# Patient Record
Sex: Male | Born: 1975 | Race: Black or African American | Hispanic: No | Marital: Married | Smoking: Never smoker
Health system: Southern US, Community
[De-identification: ages and names within clinical notes are randomized; demographics above are authoritative.]

## PROBLEM LIST (undated history)

## (undated) ENCOUNTER — Ambulatory Visit: Admission: EM

---

## 2001-10-26 ENCOUNTER — Emergency Department (HOSPITAL_COMMUNITY): Admission: EM | Admit: 2001-10-26 | Discharge: 2001-10-26 | Payer: Self-pay | Admitting: Emergency Medicine

## 2008-03-25 ENCOUNTER — Emergency Department (HOSPITAL_COMMUNITY): Admission: EM | Admit: 2008-03-25 | Discharge: 2008-03-25 | Payer: Self-pay | Admitting: Emergency Medicine

## 2012-11-18 ENCOUNTER — Emergency Department (HOSPITAL_BASED_OUTPATIENT_CLINIC_OR_DEPARTMENT_OTHER)
Admission: EM | Admit: 2012-11-18 | Discharge: 2012-11-18 | Disposition: A | Discharge status: Home / Self Care | Payer: Managed Care, Other (non HMO) | Attending: Emergency Medicine | Admitting: Emergency Medicine

## 2012-11-18 ENCOUNTER — Encounter (HOSPITAL_BASED_OUTPATIENT_CLINIC_OR_DEPARTMENT_OTHER): Payer: Self-pay

## 2012-11-18 DIAGNOSIS — R197 Diarrhea, unspecified: Secondary | ICD-10-CM | POA: Insufficient documentation

## 2012-11-18 DIAGNOSIS — R509 Fever, unspecified: Secondary | ICD-10-CM | POA: Insufficient documentation

## 2012-11-18 DIAGNOSIS — R52 Pain, unspecified: Secondary | ICD-10-CM | POA: Insufficient documentation

## 2012-11-18 DIAGNOSIS — R1110 Vomiting, unspecified: Secondary | ICD-10-CM | POA: Insufficient documentation

## 2012-11-18 LAB — COMPREHENSIVE METABOLIC PANEL
ALT: 27 U/L (ref 0–53)
Albumin: 3.7 g/dL (ref 3.5–5.2)
Albumin: 3.3 g/dL — ABNORMAL LOW (ref 3.5–5.2)
BUN: 11 mg/dL (ref 6–23)
BUN: 11 mg/dL (ref 6–23)
Calcium: 8 mg/dL — ABNORMAL LOW (ref 8.4–10.5)
Chloride: 98 mEq/L (ref 96–112)
Creatinine, Ser: 0.9 mg/dL (ref 0.50–1.35)
GFR calc non Af Amer: >90 mL/min (ref >90)
Sodium: 135 mEq/L (ref 135–145)
Total Bilirubin: 0.6 mg/dL (ref 0.3–1.2)
Total Protein: 6.5 g/dL (ref 6.0–8.3)

## 2012-11-18 LAB — CBC
Hemoglobin: 14 g/dL (ref 13.0–17.0)
MCHC: 33.8 g/dL (ref 30.0–36.0)
RDW: 15.2 % (ref 11.5–15.5)

## 2012-11-18 MED ORDER — SODIUM CHLORIDE 0.9 % IV BOLUS (SEPSIS)
1000.0000 mL | Freq: Once | INTRAVENOUS | Status: AC
  Administered 2012-11-18: 1000 mL via INTRAVENOUS

## 2012-11-18 MED ORDER — IBUPROFEN 800 MG PO TABS
800.0000 mg | ORAL_TABLET | Freq: Once | ORAL | Status: AC
  Administered 2012-11-18: 800 mg via ORAL
  Filled 2012-11-18: qty 1

## 2012-11-18 MED ORDER — POTASSIUM CHLORIDE CRYS ER 20 MEQ PO TBCR
40.0000 meq | EXTENDED_RELEASE_TABLET | Freq: Once | ORAL | Status: AC
  Administered 2012-11-18: 40 meq via ORAL
  Filled 2012-11-18: qty 2

## 2012-11-18 NOTE — ED Provider Notes (Signed)
Medical screening examination/treatment/procedure(s) were performed by non-physician practitioner and as supervising physician I was immediately available for consultation/collaboration.    Aerica Rincon L Omayra Tulloch, MD 11/18/12 1426 

## 2012-11-18 NOTE — ED Notes (Signed)
Pt reports onset of vomiting and abdominal pain unrelieved after taking Zofran.  He was seen at Urgent Care yesterday for same.

## 2012-11-18 NOTE — ED Provider Notes (Signed)
History     CSN: [id number]  Arrival date & time 11/18/12  1142   First MD Initiated Contact with Patient 11/18/12 1213      Chief Complaint  Patient presents with  . Emesis  . Diarrhea    (Consider location/radiation/quality/duration/timing/severity/associated sxs/prior treatment) Patient is a 37 y.o. male presenting with vomiting and diarrhea. The history is provided by the patient. No language interpreter was used.  Emesis Severity:  Moderate Duration:  2 days Timing:  Constant Number of daily episodes:  Multiple Quality:  Unable to specify Able to tolerate:  Liquids Progression:  Partially resolved Worsened by:  Nothing tried Ineffective treatments:  None tried Associated symptoms: diarrhea   Diarrhea Associated symptoms: vomiting   Pt complains of vomiting and diarhea since yesterday.   Pt complains of fever and bodyaches  History reviewed. No pertinent past medical history.  History reviewed. No pertinent past surgical history.  No family history on file.  History  Substance Use Topics  . Smoking status: Never Smoker   . Smokeless tobacco: Not on file  . Alcohol Use: No      Review of Systems  Gastrointestinal: Positive for vomiting and diarrhea.  All other systems reviewed and are negative.    Allergies  Review of patient's allergies indicates no known allergies.  Home Medications   Current Outpatient Rx  Name  Route  Sig  Dispense  Refill  . ondansetron (ZOFRAN-ODT) 4 MG disintegrating tablet   Oral   Take 4 mg by mouth every 8 (eight) hours as needed for nausea.           BP 107/95  Pulse 102  Temp(Src) 101.8 F (38.8 C) (Oral)  Resp 24  Ht 5\' 6"  (1.676 m)  Wt 206 lb (93.441 kg)  BMI 33.27 kg/m2  SpO2 94%  Physical Exam  Nursing note and vitals reviewed. Constitutional: He is oriented to person, place, and time. He appears well-developed and well-nourished.  HENT:  Head: Normocephalic and atraumatic.  Right Ear: External ear  normal.  Left Ear: External ear normal.  Eyes: Conjunctivae are normal. Pupils are equal, round, and reactive to light.  Neck: Normal range of motion. Neck supple.  Cardiovascular: Normal rate, regular rhythm and normal heart sounds.   Pulmonary/Chest: Effort normal and breath sounds normal.  Abdominal: Soft. Bowel sounds are normal.  Musculoskeletal: Normal range of motion.  Neurological: He is alert and oriented to person, place, and time. He has normal reflexes.  Skin: Skin is warm.  Psychiatric: He has a normal mood and affect.    ED Course  Procedures (including critical care time)  Labs Reviewed  CBC - Abnormal; Notable for the following:    MCV 74.1 (*)    MCH 25.0 (*)    All other components within normal limits  COMPREHENSIVE METABOLIC PANEL - Abnormal; Notable for the following:    Glucose, Bld 125 (*)    All other components within normal limits  COMPREHENSIVE METABOLIC PANEL - Abnormal; Notable for the following:    Potassium 2.9 (*)    Glucose, Bld 118 (*)    Calcium 8.0 (*)    Albumin 3.3 (*)    All other components within normal limits   No results found.   1. Vomiting   2. Diarrhea       MDM  IV nS x 2 liters,  Ibuprofen for fever,  Pt given potasium for k of 2.9   Pt feels better after fluids.  Lonia Skinner [place], PA-C 11/18/12 1420

## 2012-11-18 NOTE — ED Notes (Signed)
Lab called and sts cmp hemolyzed. New draw will be performed.

## 2015-07-22 ENCOUNTER — Emergency Department (HOSPITAL_BASED_OUTPATIENT_CLINIC_OR_DEPARTMENT_OTHER)
Admission: EM | Admit: 2015-07-22 | Discharge: 2015-07-22 | Disposition: A | Discharge status: Home / Self Care | Payer: Managed Care, Other (non HMO) | Attending: Emergency Medicine | Admitting: Emergency Medicine

## 2015-07-22 ENCOUNTER — Emergency Department (HOSPITAL_BASED_OUTPATIENT_CLINIC_OR_DEPARTMENT_OTHER): Payer: Managed Care, Other (non HMO)

## 2015-07-22 ENCOUNTER — Encounter (HOSPITAL_BASED_OUTPATIENT_CLINIC_OR_DEPARTMENT_OTHER): Payer: Self-pay | Admitting: *Deleted

## 2015-07-22 DIAGNOSIS — R1084 Generalized abdominal pain: Secondary | ICD-10-CM | POA: Insufficient documentation

## 2015-07-22 DIAGNOSIS — R112 Nausea with vomiting, unspecified: Secondary | ICD-10-CM | POA: Insufficient documentation

## 2015-07-22 LAB — URINALYSIS, ROUTINE W REFLEX MICROSCOPIC
BILIRUBIN URINE: NEGATIVE
GLUCOSE, UA: NEGATIVE mg/dL
HGB URINE DIPSTICK: NEGATIVE
Ketones, ur: NEGATIVE mg/dL
Leukocytes, UA: NEGATIVE
Nitrite: NEGATIVE
PH: 8.5 — AB (ref 5.0–8.0)
Protein, ur: NEGATIVE mg/dL
SPECIFIC GRAVITY, URINE: 1.017 (ref 1.005–1.030)

## 2015-07-22 LAB — COMPREHENSIVE METABOLIC PANEL
ALBUMIN: 4.5 g/dL (ref 3.5–5.0)
ALT: 22 U/L (ref 17–63)
AST: 26 U/L (ref 15–41)
Alkaline Phosphatase: 63 U/L (ref 38–126)
Anion gap: 8 (ref 5–15)
BUN: 12 mg/dL (ref 6–20)
CALCIUM: 9.5 mg/dL (ref 8.9–10.3)
CO2: 28 mmol/L (ref 22–32)
CREATININE: 0.88 mg/dL (ref 0.61–1.24)
Chloride: 104 mmol/L (ref 101–111)
GFR calc Af Amer: >60 mL/min (ref >60)
GLUCOSE: 121 mg/dL — AB (ref 65–99)
Potassium: 3.2 mmol/L — ABNORMAL LOW (ref 3.5–5.1)
Sodium: 140 mmol/L (ref 135–145)
Total Bilirubin: 0.5 mg/dL (ref 0.3–1.2)
Total Protein: 8 g/dL (ref 6.5–8.1)

## 2015-07-22 LAB — CBC WITH DIFFERENTIAL/PLATELET
BASOS PCT: 0 %
Basophils Absolute: 0 10*3/uL (ref 0.0–0.1)
Eosinophils Absolute: 0.3 10*3/uL (ref 0.0–0.7)
Eosinophils Relative: 3 %
HCT: 44.5 % (ref 39.0–52.0)
Hemoglobin: 14.3 g/dL (ref 13.0–17.0)
LYMPHS ABS: 1.7 10*3/uL (ref 0.7–4.0)
Lymphocytes Relative: 21 %
MCH: 24.6 pg — ABNORMAL LOW (ref 26.0–34.0)
MCHC: 32.1 g/dL (ref 30.0–36.0)
MCV: 76.5 fL — AB (ref 78.0–100.0)
MONOS PCT: 7 %
Monocytes Absolute: 0.6 10*3/uL (ref 0.1–1.0)
NEUTROS ABS: 5.5 10*3/uL (ref 1.7–7.7)
NEUTROS PCT: 68 %
PLATELETS: 229 10*3/uL (ref 150–400)
RBC: 5.82 MIL/uL — AB (ref 4.22–5.81)
RDW: 15.3 % (ref 11.5–15.5)
WBC: 8 10*3/uL (ref 4.0–10.5)

## 2015-07-22 LAB — LIPASE, BLOOD: Lipase: 25 U/L (ref 11–51)

## 2015-07-22 MED ORDER — IOHEXOL 300 MG/ML  SOLN: 100.0000 mL | Freq: Once | INTRAMUSCULAR | Status: DC | PRN

## 2015-07-22 MED ORDER — OXYCODONE-ACETAMINOPHEN 5-325 MG PO TABS: 1.0000 | ORAL_TABLET | ORAL | Status: DC | PRN

## 2015-07-22 MED ORDER — FAMOTIDINE IN NACL 20-0.9 MG/50ML-% IV SOLN
20.0000 mg | Freq: Once | INTRAVENOUS | Status: AC
  Administered 2015-07-22: 20 mg via INTRAVENOUS
  Filled 2015-07-22: qty 50

## 2015-07-22 MED ORDER — IOHEXOL 300 MG/ML  SOLN
50.0000 mL | Freq: Once | INTRAMUSCULAR | Status: DC | PRN
  Administered 2015-07-22: 50 mL via INTRAVENOUS

## 2015-07-22 MED ORDER — SODIUM CHLORIDE 0.9 % IV BOLUS (SEPSIS)
500.0000 mL | Freq: Once | INTRAVENOUS | Status: AC
  Administered 2015-07-22: 500 mL via INTRAVENOUS

## 2015-07-22 MED ORDER — ONDANSETRON HCL 4 MG/2ML IJ SOLN
4.0000 mg | Freq: Once | INTRAMUSCULAR | Status: AC
  Administered 2015-07-22: 4 mg via INTRAVENOUS
  Filled 2015-07-22: qty 2

## 2015-07-22 MED ORDER — IOHEXOL 300 MG/ML  SOLN
50.0000 mL | Freq: Once | INTRAMUSCULAR | Status: AC | PRN
  Administered 2015-07-22: 50 mL via ORAL

## 2015-07-22 MED ORDER — MORPHINE SULFATE (PF) 4 MG/ML IV SOLN
4.0000 mg | Freq: Once | INTRAVENOUS | Status: AC
  Administered 2015-07-22: 4 mg via INTRAVENOUS
  Filled 2015-07-22: qty 1

## 2015-07-22 MED ORDER — ONDANSETRON HCL 4 MG PO TABS: 4.0000 mg | ORAL_TABLET | Freq: Four times a day (QID) | ORAL | Status: DC

## 2015-07-22 NOTE — ED Notes (Signed)
C/o rt side abd pain onset around 2200 last pm w nausea,  Vomited x 1

## 2015-07-22 NOTE — ED Notes (Signed)
Abdominal pain since 10pm tonight.  Reports vomiting x 1.  Denies diarrhea.  RLQ and RUQ intermittent pain.

## 2015-07-22 NOTE — ED Provider Notes (Signed)
CSN: [id number]     Arrival date & time 07/22/15  0304 History   First MD Initiated Contact with Patient 07/22/15 [phone]     Chief Complaint  Patient presents with  . Abdominal Pain     (Consider location/radiation/quality/duration/timing/severity/associated sxs/prior Treatment) HPI Comments: Patient presents to the ER for evaluation of right-sided abdominal pain. Patient reports onset of pain around 10 PM tonight after eating spicy food. Pain has been constant, moderate to severe. He has had nausea and has vomited once. No diarrhea. No hematemesis or rectal bleeding.  Patient is a 39 y.o. male presenting with abdominal pain.  Abdominal Pain Associated symptoms: nausea and vomiting     History reviewed. No pertinent past medical history. History reviewed. No pertinent past surgical history. History reviewed. No pertinent family history. Social History  Substance Use Topics  . Smoking status: Never Smoker   . Smokeless tobacco: None  . Alcohol Use: No    Review of Systems  Gastrointestinal: Positive for nausea, vomiting and abdominal pain.  All other systems reviewed and are negative.     Allergies  Review of patient's allergies indicates no known allergies.  Home Medications   Prior to Admission medications   Not on File   BP 133/79 mmHg  Pulse 64  Temp(Src) 98.4 F (36.9 C) (Oral)  Resp 18  Ht 5\' 6"  (1.676 m)  Wt 205 lb (92.987 kg)  BMI 33.10 kg/m2  SpO2 99% Physical Exam  Constitutional: He is oriented to person, place, and time. He appears well-developed and well-nourished. No distress.  HENT:  Head: Normocephalic and atraumatic.  Right Ear: Hearing normal.  Left Ear: Hearing normal.  Nose: Nose normal.  Mouth/Throat: Oropharynx is clear and moist and mucous membranes are normal.  Eyes: Conjunctivae and EOM are normal. Pupils are equal, round, and reactive to light.  Neck: Normal range of motion. Neck supple.  Cardiovascular: Regular rhythm, S1 normal and  S2 normal.  Exam reveals no gallop and no friction rub.   No murmur heard. Pulmonary/Chest: Effort normal and breath sounds normal. No respiratory distress. He exhibits no tenderness.  Abdominal: Soft. Normal appearance and bowel sounds are normal. There is no hepatosplenomegaly. There is tenderness in the right upper quadrant. There is no rebound, no guarding, no tenderness at McBurney's point and negative Murphy's sign. No hernia.  Musculoskeletal: Normal range of motion.  Neurological: He is alert and oriented to person, place, and time. He has normal strength. No cranial nerve deficit or sensory deficit. Coordination normal. GCS eye subscore is 4. GCS verbal subscore is 5. GCS motor subscore is 6.  Skin: Skin is warm, dry and intact. No rash noted. No cyanosis.  Psychiatric: He has a normal mood and affect. His speech is normal and behavior is normal. Thought content normal.  Nursing note and vitals reviewed.   ED Course  Procedures (including critical care time) Labs Review Labs Reviewed  CBC WITH DIFFERENTIAL/PLATELET - Abnormal; Notable for the following:    RBC 5.82 (*)    MCV 76.5 (*)    MCH 24.6 (*)    All other components within normal limits  COMPREHENSIVE METABOLIC PANEL - Abnormal; Notable for the following:    Potassium 3.2 (*)    Glucose, Bld 121 (*)    All other components within normal limits  URINALYSIS, ROUTINE W REFLEX MICROSCOPIC (NOT AT [facility]) - Abnormal; Notable for the following:    APPearance CLOUDY (*)    pH 8.5 (*)    All other  components within normal limits  LIPASE, BLOOD    Imaging Review Ct Abdomen Pelvis W Contrast  07/22/2015  CLINICAL DATA:  Abdominal pain beginning last night, vomiting. RIGHT upper and lower quadrant pain. EXAM: CT ABDOMEN AND PELVIS WITH CONTRAST TECHNIQUE: Multidetector CT imaging of the abdomen and pelvis was performed using the standard protocol following bolus administration of intravenous contrast. CONTRAST:  50mL OMNIPAQUE  IOHEXOL 300 MG/ML SOLN, 50mL OMNIPAQUE IOHEXOL 300 MG/ML SOLN COMPARISON:  None. FINDINGS: LUNG BASES: Included view of the lung bases are clear. Visualized heart and pericardium are unremarkable. SOLID ORGANS: The liver, spleen, gallbladder, pancreas and adrenal glands are unremarkable. GASTROINTESTINAL TRACT: Small hiatal hernia. The stomach is distended with contrast. Small duodenum diverticulum. Small and large bowel are normal in course and caliber without inflammatory changes. Normal appendix. KIDNEYS/ URINARY TRACT: Kidneys are orthotopic, demonstrating symmetric enhancement. No nephrolithiasis, hydronephrosis or solid renal masses. The unopacified ureters are normal in course and caliber. Urinary bladder is partially distended and unremarkable. PERITONEUM/RETROPERITONEUM: Aortoiliac vessels are normal in course and caliber. No lymphadenopathy by CT size criteria. Prostate size is normal. No intraperitoneal free fluid nor free air. SOFT TISSUE/OSSEOUS STRUCTURES: Non-suspicious. Mild congenital canal narrowing of the lumbar spine on the basis of foreshortened pedicles. Broad thoracolumbar dextroscoliosis could be positional. IMPRESSION: Contrast distended stomach. No acute intra-abdominal or pelvic process. Normal appendix. Electronically Signed   By: Awilda Metro M.D.   On: 07/22/2015 05:13   I have personally reviewed and evaluated these images and lab results as part of my medical decision-making.   EKG Interpretation None      MDM   Final diagnoses:  None   abdominal pain  Presents to the emergency department for evaluation of abdominal pain. Patient reports symptoms began around 10 PM. He had eaten prior to onset of pain. He has vomited once. Examination revealed mild tenderness diffusely on the right side with rebound. No signs of peritonitis or acute surgical process. Lab work was unremarkable. CT scan performed to further evaluate right-sided pain. No acute abnormality noted other  than distended stomach. Patient treated with analgesia, will discharge continue symptomatic treatment.    Gilda Crease, MD 07/22/15 [phone]

## 2015-07-22 NOTE — Discharge Instructions (Signed)

## 2017-11-22 DIAGNOSIS — J209 Acute bronchitis, unspecified: Secondary | ICD-10-CM | POA: Diagnosis not present

## 2017-11-22 DIAGNOSIS — J309 Allergic rhinitis, unspecified: Secondary | ICD-10-CM | POA: Diagnosis not present

## 2018-09-20 DIAGNOSIS — J45909 Unspecified asthma, uncomplicated: Secondary | ICD-10-CM | POA: Diagnosis not present

## 2018-09-20 DIAGNOSIS — J069 Acute upper respiratory infection, unspecified: Secondary | ICD-10-CM | POA: Diagnosis not present

## 2018-11-20 ENCOUNTER — Observation Stay (HOSPITAL_COMMUNITY): Payer: BLUE CROSS/BLUE SHIELD

## 2018-11-20 ENCOUNTER — Encounter (HOSPITAL_BASED_OUTPATIENT_CLINIC_OR_DEPARTMENT_OTHER): Payer: Self-pay

## 2018-11-20 ENCOUNTER — Emergency Department (HOSPITAL_BASED_OUTPATIENT_CLINIC_OR_DEPARTMENT_OTHER): Payer: BLUE CROSS/BLUE SHIELD

## 2018-11-20 ENCOUNTER — Other Ambulatory Visit: Payer: Self-pay

## 2018-11-20 ENCOUNTER — Inpatient Hospital Stay (HOSPITAL_BASED_OUTPATIENT_CLINIC_OR_DEPARTMENT_OTHER)
Admission: EM | Admit: 2018-11-20 | Discharge: 2018-11-28 | DRG: 419 | Disposition: A | Discharge status: Home / Self Care | Payer: BLUE CROSS/BLUE SHIELD | Attending: Internal Medicine | Admitting: Internal Medicine

## 2018-11-20 DIAGNOSIS — E876 Hypokalemia: Secondary | ICD-10-CM | POA: Diagnosis not present

## 2018-11-20 DIAGNOSIS — R945 Abnormal results of liver function studies: Secondary | ICD-10-CM

## 2018-11-20 DIAGNOSIS — K219 Gastro-esophageal reflux disease without esophagitis: Secondary | ICD-10-CM | POA: Diagnosis not present

## 2018-11-20 DIAGNOSIS — F1210 Cannabis abuse, uncomplicated: Secondary | ICD-10-CM | POA: Diagnosis not present

## 2018-11-20 DIAGNOSIS — E669 Obesity, unspecified: Secondary | ICD-10-CM | POA: Diagnosis present

## 2018-11-20 DIAGNOSIS — K2900 Acute gastritis without bleeding: Secondary | ICD-10-CM

## 2018-11-20 DIAGNOSIS — Z419 Encounter for procedure for purposes other than remedying health state, unspecified: Secondary | ICD-10-CM

## 2018-11-20 DIAGNOSIS — K812 Acute cholecystitis with chronic cholecystitis: Secondary | ICD-10-CM | POA: Diagnosis not present

## 2018-11-20 DIAGNOSIS — R740 Nonspecific elevation of levels of transaminase and lactic acid dehydrogenase [LDH]: Secondary | ICD-10-CM

## 2018-11-20 DIAGNOSIS — R1110 Vomiting, unspecified: Secondary | ICD-10-CM | POA: Diagnosis not present

## 2018-11-20 DIAGNOSIS — R110 Nausea: Secondary | ICD-10-CM

## 2018-11-20 DIAGNOSIS — R748 Abnormal levels of other serum enzymes: Secondary | ICD-10-CM | POA: Diagnosis not present

## 2018-11-20 DIAGNOSIS — K8010 Calculus of gallbladder with chronic cholecystitis without obstruction: Secondary | ICD-10-CM | POA: Diagnosis not present

## 2018-11-20 DIAGNOSIS — K8063 Calculus of gallbladder and bile duct with acute cholecystitis with obstruction: Secondary | ICD-10-CM | POA: Diagnosis not present

## 2018-11-20 DIAGNOSIS — Z6832 Body mass index (BMI) 32.0-32.9, adult: Secondary | ICD-10-CM

## 2018-11-20 DIAGNOSIS — R1013 Epigastric pain: Secondary | ICD-10-CM | POA: Diagnosis not present

## 2018-11-20 DIAGNOSIS — K8020 Calculus of gallbladder without cholecystitis without obstruction: Secondary | ICD-10-CM | POA: Diagnosis not present

## 2018-11-20 DIAGNOSIS — K449 Diaphragmatic hernia without obstruction or gangrene: Secondary | ICD-10-CM | POA: Diagnosis present

## 2018-11-20 DIAGNOSIS — K8067 Calculus of gallbladder and bile duct with acute and chronic cholecystitis with obstruction: Secondary | ICD-10-CM | POA: Diagnosis not present

## 2018-11-20 DIAGNOSIS — Z79899 Other long term (current) drug therapy: Secondary | ICD-10-CM | POA: Diagnosis not present

## 2018-11-20 DIAGNOSIS — R7989 Other specified abnormal findings of blood chemistry: Secondary | ICD-10-CM | POA: Diagnosis not present

## 2018-11-20 DIAGNOSIS — R109 Unspecified abdominal pain: Secondary | ICD-10-CM

## 2018-11-20 DIAGNOSIS — R7401 Elevation of levels of liver transaminase levels: Secondary | ICD-10-CM | POA: Diagnosis present

## 2018-11-20 DIAGNOSIS — R1010 Upper abdominal pain, unspecified: Secondary | ICD-10-CM | POA: Diagnosis not present

## 2018-11-20 DIAGNOSIS — K810 Acute cholecystitis: Secondary | ICD-10-CM | POA: Diagnosis not present

## 2018-11-20 DIAGNOSIS — K838 Other specified diseases of biliary tract: Secondary | ICD-10-CM | POA: Diagnosis not present

## 2018-11-20 DIAGNOSIS — K8021 Calculus of gallbladder without cholecystitis with obstruction: Secondary | ICD-10-CM

## 2018-11-20 DIAGNOSIS — K8050 Calculus of bile duct without cholangitis or cholecystitis without obstruction: Secondary | ICD-10-CM | POA: Diagnosis present

## 2018-11-20 DIAGNOSIS — K8042 Calculus of bile duct with acute cholecystitis without obstruction: Secondary | ICD-10-CM | POA: Diagnosis not present

## 2018-11-20 LAB — URINALYSIS, ROUTINE W REFLEX MICROSCOPIC
Glucose, UA: NEGATIVE mg/dL
Hgb urine dipstick: NEGATIVE
Ketones, ur: NEGATIVE mg/dL
Leukocytes,Ua: NEGATIVE
Nitrite: NEGATIVE
Protein, ur: NEGATIVE mg/dL
Specific Gravity, Urine: <1.005 — ABNORMAL LOW (ref 1.005–1.030)
pH: 7 (ref 5.0–8.0)

## 2018-11-20 LAB — HEPATIC FUNCTION PANEL
ALT: 691 U/L — ABNORMAL HIGH (ref 0–44)
AST: 337 U/L — ABNORMAL HIGH (ref 15–41)
Albumin: 4.1 g/dL (ref 3.5–5.0)
Alkaline Phosphatase: 118 U/L (ref 38–126)
Bilirubin, Direct: 3.1 mg/dL — ABNORMAL HIGH (ref 0.0–0.2)
Indirect Bilirubin: 1.9 mg/dL — ABNORMAL HIGH (ref 0.3–0.9)
Total Bilirubin: 5 mg/dL — ABNORMAL HIGH (ref 0.3–1.2)
Total Protein: 7.7 g/dL (ref 6.5–8.1)

## 2018-11-20 LAB — POTASSIUM: Potassium: 3.8 mmol/L (ref 3.5–5.1)

## 2018-11-20 LAB — COMPREHENSIVE METABOLIC PANEL
ALT: 770 U/L — ABNORMAL HIGH (ref 0–44)
AST: 430 U/L — ABNORMAL HIGH (ref 15–41)
Albumin: 4.1 g/dL (ref 3.5–5.0)
Alkaline Phosphatase: 112 U/L (ref 38–126)
Anion gap: 8 (ref 5–15)
BUN: 5 mg/dL — ABNORMAL LOW (ref 6–20)
CO2: 26 mmol/L (ref 22–32)
Calcium: 9 mg/dL (ref 8.9–10.3)
Chloride: 102 mmol/L (ref 98–111)
Creatinine, Ser: 0.81 mg/dL (ref 0.61–1.24)
GFR calc Af Amer: >60 mL/min (ref >60)
GFR calc non Af Amer: >60 mL/min (ref >60)
Glucose, Bld: 124 mg/dL — ABNORMAL HIGH (ref 70–99)
Potassium: 3.2 mmol/L — ABNORMAL LOW (ref 3.5–5.1)
Sodium: 136 mmol/L (ref 135–145)
Total Bilirubin: 4.6 mg/dL — ABNORMAL HIGH (ref 0.3–1.2)
Total Protein: 7.7 g/dL (ref 6.5–8.1)

## 2018-11-20 LAB — CBC
HCT: 49.8 % (ref 39.0–52.0)
Hemoglobin: 15.4 g/dL (ref 13.0–17.0)
MCH: 24.4 pg — ABNORMAL LOW (ref 26.0–34.0)
MCHC: 30.9 g/dL (ref 30.0–36.0)
MCV: 78.9 fL — ABNORMAL LOW (ref 80.0–100.0)
Platelets: 205 10*3/uL (ref 150–400)
RBC: 6.31 MIL/uL — ABNORMAL HIGH (ref 4.22–5.81)
RDW: 15.2 % (ref 11.5–15.5)
WBC: 5.7 10*3/uL (ref 4.0–10.5)
nRBC: 0 % (ref 0.0–0.2)

## 2018-11-20 LAB — LIPASE, BLOOD: Lipase: 25 U/L (ref 11–51)

## 2018-11-20 MED ORDER — FAMOTIDINE IN NACL 20-0.9 MG/50ML-% IV SOLN
20.0000 mg | Freq: Two times a day (BID) | INTRAVENOUS | Status: DC
  Administered 2018-11-20 – 2018-11-25 (×9): 20 mg via INTRAVENOUS
  Filled 2018-11-20 (×9): qty 50

## 2018-11-20 MED ORDER — MORPHINE SULFATE (PF) 4 MG/ML IV SOLN
4.0000 mg | Freq: Once | INTRAVENOUS | Status: AC
  Administered 2018-11-20: 4 mg via INTRAVENOUS
  Filled 2018-11-20: qty 1

## 2018-11-20 MED ORDER — GADOBUTROL 1 MMOL/ML IV SOLN
10.0000 mL | Freq: Once | INTRAVENOUS | Status: AC | PRN
  Administered 2018-11-20: 10 mL via INTRAVENOUS

## 2018-11-20 MED ORDER — LEVOFLOXACIN IN D5W 500 MG/100ML IV SOLN: 500.0000 mg | INTRAVENOUS | Status: DC

## 2018-11-20 MED ORDER — BISACODYL 5 MG PO TBEC: 5.0000 mg | DELAYED_RELEASE_TABLET | Freq: Every day | ORAL | Status: DC | PRN

## 2018-11-20 MED ORDER — HYDROXYZINE HCL 10 MG PO TABS
10.0000 mg | ORAL_TABLET | Freq: Three times a day (TID) | ORAL | Status: DC | PRN
  Filled 2018-11-20: qty 1

## 2018-11-20 MED ORDER — SODIUM CHLORIDE 0.9 % IV SOLN
3.0000 g | Freq: Four times a day (QID) | INTRAVENOUS | Status: DC
  Administered 2018-11-21 – 2018-11-23 (×12): 3 g via INTRAVENOUS
  Filled 2018-11-20 (×13): qty 3

## 2018-11-20 MED ORDER — SODIUM CHLORIDE 0.9 % IV BOLUS
1000.0000 mL | Freq: Once | INTRAVENOUS | Status: AC
  Administered 2018-11-20: 1000 mL via INTRAVENOUS

## 2018-11-20 MED ORDER — IOHEXOL 300 MG/ML  SOLN
100.0000 mL | Freq: Once | INTRAMUSCULAR | Status: AC | PRN
  Administered 2018-11-20: 100 mL via INTRAVENOUS

## 2018-11-20 MED ORDER — LACTATED RINGERS IV SOLN
INTRAVENOUS | Status: DC
  Administered 2018-11-20 – 2018-11-24 (×9): via INTRAVENOUS
  Administered 2018-11-25: 11:00:00 1000 mL via INTRAVENOUS
  Administered 2018-11-28: 08:00:00 via INTRAVENOUS

## 2018-11-20 MED ORDER — ONDANSETRON HCL 4 MG/2ML IJ SOLN
4.0000 mg | Freq: Four times a day (QID) | INTRAMUSCULAR | Status: DC | PRN
  Administered 2018-11-21 – 2018-11-22 (×2): 4 mg via INTRAVENOUS
  Filled 2018-11-20 (×2): qty 2

## 2018-11-20 MED ORDER — OXYCODONE HCL 5 MG PO TABS
5.0000 mg | ORAL_TABLET | ORAL | Status: DC | PRN
  Administered 2018-11-21 – 2018-11-24 (×4): 5 mg via ORAL
  Filled 2018-11-20 (×4): qty 1

## 2018-11-20 MED ORDER — POTASSIUM CHLORIDE 20 MEQ PO PACK
40.0000 meq | PACK | Freq: Once | ORAL | Status: AC
  Administered 2018-11-20: 18:00:00 40 meq via ORAL
  Filled 2018-11-20: qty 2

## 2018-11-20 MED ORDER — ONDANSETRON HCL 4 MG/2ML IJ SOLN
4.0000 mg | Freq: Once | INTRAMUSCULAR | Status: AC
  Administered 2018-11-20: 4 mg via INTRAVENOUS
  Filled 2018-11-20: qty 2

## 2018-11-20 MED ORDER — POTASSIUM CHLORIDE 10 MEQ/100ML IV SOLN
10.0000 meq | INTRAVENOUS | Status: AC
  Administered 2018-11-20 (×2): 10 meq via INTRAVENOUS
  Filled 2018-11-20: qty 100

## 2018-11-20 MED ORDER — IOPAMIDOL (ISOVUE-300) INJECTION 61%
50.0000 mL | Freq: Once | INTRAVENOUS | Status: AC | PRN
  Administered 2018-11-20: 09:00:00 15 mL via ORAL

## 2018-11-20 NOTE — Plan of Care (Signed)
  Problem: Education: Goal: Knowledge of General Education information will improve Description Including pain rating scale, medication(s)/side effects and non-pharmacologic comfort measures Outcome: Progressing   Problem: Health Behavior/Discharge Planning: Goal: Ability to manage health-related needs will improve Outcome: Progressing   Problem: Clinical Measurements: Goal: Ability to maintain clinical measurements within normal limits will improve Outcome: Progressing Goal: Will remain free from infection Outcome: Progressing Goal: Respiratory complications will improve Outcome: Progressing Goal: Cardiovascular complication will be avoided Outcome: Progressing   Problem: Activity: Goal: Risk for activity intolerance will decrease Outcome: Progressing   Problem: Nutrition: Goal: Adequate nutrition will be maintained Outcome: Progressing   Problem: Coping: Goal: Level of anxiety will decrease Outcome: Progressing   Problem: Elimination: Goal: Will not experience complications related to bowel motility Outcome: Progressing Goal: Will not experience complications related to urinary retention Outcome: Progressing   Problem: Pain Managment: Goal: General experience of comfort will improve Outcome: Progressing   Problem: Safety: Goal: Ability to remain free from injury will improve Outcome: Progressing   Problem: Skin Integrity: Goal: Risk for impaired skin integrity will decrease Outcome: Progressing   Problem: Clinical Measurements: Goal: Diagnostic test results will improve Outcome: Not Progressing Note:  Day of admission

## 2018-11-20 NOTE — ED Notes (Signed)
Transported by [facility] to [facility]

## 2018-11-20 NOTE — ED Triage Notes (Signed)
Pt reports abdominal pain and vomiting x1 since Wednesday.  Pt reports diffuse middle abdominal pain.  No active vomiting on arrival.

## 2018-11-20 NOTE — ED Notes (Signed)
Drinking oral contrast 

## 2018-11-20 NOTE — H&P (Addendum)
History and Physical    DOA: 11/20/2018  PCP: Patient, No Pcp Per  Patient coming from: home  Chief Complaint: abdominal pain  HPI: Alejandro Willis is a 43 y.o. male with no significant past medical history presents with complaints of abdominal pain for 2 days.  Patient states he has had issues with on and off GI symptoms/abdominal discomfort for couple of years.  In 2016 he underwent CT abdomen which only showed small hiatal hernia, otherwise normal.  He admits to smoking marijuana every day.  On Wednesday, around midnight patient experienced an episode of severe abdominal pain, epigastric, 8/10, nonradiating associated with nausea and vomiting x2.  He states abdominal pain subsequently eased to about 3/10, and persisted throughout yesterday.  This morning around 6 AM patient again had exacerbation of abdominal pain to 8/10, similar in quality to prior episode on Wednesday which prompted him to seek medical attention at [facility].  He denies history of hematemesis or melena or hematochezia or diarrhea.  He denies history of alcohol use. The only medication he uses is Claritin as needed.  Denies any history of fever or chills.  Denies any history of travel or sick contacts (GI or respiratory symptoms or coronavirus exposure)  Work-up at outside ED revealed elevated LFTs with ALT in 700, AST in 200s and total bili at 4. Denies any history of gallstones but CT abdomen obtained at [facility] ED revealed gallstones, also confirmed by ultrasonogram without any evidence of CBD dilatation or pericholecystic fluid.  Patient received 4 mg of IV morphine and 4 mg of IV Zofran along with 1 L of IV fluids.  He currently rates his pain at 0/10.  Case was discussed with on-call GI and hospitalist.  Patient accepted in transfer to [facility] long observation bed for further evaluation and management.  Review of Systems: As per HPI otherwise 10 point review of systems negative.    History  reviewed. No pertinent past medical history.  Except for congenital scoliosis, small hiatal hernia  History reviewed. No pertinent surgical history.  Social history:  reports that he has never smoked. He has never used smokeless tobacco. He reports current drug use. Frequency: 7.00 times per week. Drug: Marijuana. He reports that he does not drink alcohol.   No Known Allergies  History reviewed. No pertinent family history. Mom 52 y/o : DDD /back pain. Father 79 y/o inguinal hernia. 3 brothers and 1 sister helathy. 1 brother died , unclear cause but had h/o gout     Prior to Admission medications   Medication Sig Start Date End Date Taking? Authorizing Provider  ondansetron (ZOFRAN) 4 MG tablet Take 1 tablet (4 mg total) by mouth every 6 (six) hours. 07/22/15   Gilda Crease, MD  oxyCODONE-acetaminophen (PERCOCET) 5-325 MG tablet Take 1-2 tablets by mouth every 4 (four) hours as needed. 07/22/15   Gilda Crease, MD  Claritin as needed  Physical Exam: Vitals:   11/20/18 1041 11/20/18 1317 11/20/18 1543 11/20/18 1631  BP: 118/75 120/83 122/78 124/80  Pulse: 62 68 68 69  Resp: 16 16  18   Temp:  98 F (36.7 C) 98.2 F (36.8 C) 98.1 F (36.7 C)  TempSrc:  Oral Oral Oral  SpO2: 98% 99% 99% 96%  Weight:      Height:        Constitutional: NAD, calm, comfortable Eyes: PERRL, lids and conjunctivae normal ENMT: Mucous membranes are moist. Posterior pharynx clear of any exudate or lesions.Normal  dentition.  Neck: normal, supple, no masses, no thyromegaly Respiratory: clear to auscultation bilaterally, no wheezing, no crackles. Normal respiratory effort. No accessory muscle use.  Cardiovascular: Regular rate and rhythm, no murmurs / rubs / gallops. No extremity edema. 2+ pedal pulses. No carotid bruits.  Abdomen: Mild epigastric tenderness, no right upper quadrant tenderness or rebound or guarding.  No masses palpated. No hepatosplenomegaly. Bowel sounds positive.    Musculoskeletal: no clubbing / cyanosis. No joint deformity upper and lower extremities. Good ROM, no contractures. Normal muscle tone.  Neurologic: CN 2-12 grossly intact. Sensation intact, DTR normal. Strength 5/5 in all 4.  Psychiatric: Normal judgment and insight. Alert and oriented x 3. Normal mood.  SKIN/catheters: no rashes, lesions, ulcers. No induration  Labs on Admission: I have personally reviewed following labs and imaging studies  CBC: Recent Labs  Lab 11/20/18 0910  WBC 5.7  HGB 15.4  HCT 49.8  MCV 78.9*  PLT 205   Basic Metabolic Panel: Recent Labs  Lab 11/20/18 0910  NA 136  K 3.2*  CL 102  CO2 26  GLUCOSE 124*  BUN 5*  CREATININE 0.81  CALCIUM 9.0   GFR: Estimated Creatinine Clearance: 126.6 mL/min (by C-G formula based on SCr of 0.81 mg/dL). Liver Function Tests: Recent Labs  Lab 11/20/18 0910  AST 430*  ALT 770*  ALKPHOS 112  BILITOT 4.6*  PROT 7.7  ALBUMIN 4.1   Recent Labs  Lab 11/20/18 0910  LIPASE 25   No results for input(s): AMMONIA in the last 168 hours. Coagulation Profile: No results for input(s): INR, PROTIME in the last 168 hours. Cardiac Enzymes: No results for input(s): CKTOTAL, CKMB, CKMBINDEX, TROPONINI in the last 168 hours. BNP (last 3 results) No results for input(s): PROBNP in the last 8760 hours. HbA1C: No results for input(s): HGBA1C in the last 72 hours. CBG: No results for input(s): GLUCAP in the last 168 hours. Lipid Profile: No results for input(s): CHOL, HDL, LDLCALC, TRIG, CHOLHDL, LDLDIRECT in the last 72 hours. Thyroid Function Tests: No results for input(s): TSH, T4TOTAL, FREET4, T3FREE, THYROIDAB in the last 72 hours. Anemia Panel: No results for input(s): VITAMINB12, FOLATE, FERRITIN, TIBC, IRON, RETICCTPCT in the last 72 hours. Urine analysis:    Component Value Date/Time   COLORURINE YELLOW 11/20/2018 0910   APPEARANCEUR CLEAR 11/20/2018 0910   LABSPEC <1.005 (L) 11/20/2018 0910   PHURINE 7.0  11/20/2018 0910   GLUCOSEU NEGATIVE 11/20/2018 0910   HGBUR NEGATIVE 11/20/2018 0910   BILIRUBINUR MODERATE (A) 11/20/2018 0910   KETONESUR NEGATIVE 11/20/2018 0910   PROTEINUR NEGATIVE 11/20/2018 0910   NITRITE NEGATIVE 11/20/2018 0910   LEUKOCYTESUR NEGATIVE 11/20/2018 0910    Radiological Exams on Admission: Ct Abdomen Pelvis W Contrast  Result Date: 11/20/2018 CLINICAL DATA:  43 year old male with a history of 2 days upper abdominal pain and vomiting EXAM: CT ABDOMEN AND PELVIS WITH CONTRAST TECHNIQUE: Multidetector CT imaging of the abdomen and pelvis was performed using the standard protocol following bolus administration of intravenous contrast. CONTRAST:  OMNIPAQUE IOHEXOL 300 MG/ML SOLN, 15mL ISOVUE-300 IOPAMIDOL (ISOVUE-300) INJECTION 61% COMPARISON:  07/22/2015 FINDINGS: Lower chest: No acute finding Hepatobiliary: Unremarkable appearance of liver parenchyma. Calcified cholelithiasis at the dependent aspect of the gallbladder with no pericholecystic fluid or inflammatory changes. The course of the common bile duct unremarkable with no intrahepatic or extrahepatic biliary ductal dilatation. Pancreas: Unremarkable Spleen: Unremarkable Adrenals/Urinary Tract: Unremarkable appearance of the adrenal glands. No evidence of hydronephrosis of the right or left kidney.  No nephrolithiasis. Unremarkable course of the bilateral ureters. Unremarkable appearance of the urinary bladder. Stomach/Bowel: Unremarkable stomach. Unremarkable small bowel. No abnormal distention. No transition point. Normal appendix. Diverticular disease without evidence of acute inflammatory changes. No significant stool burden. Vascular/Lymphatic: No atherosclerotic changes. No aneurysm. Proximal femoral arteries patent. No adenopathy. Reproductive: Unremarkable pelvic organs. Other: None Musculoskeletal: No acute displaced fracture. Scoliotic curvature again noted. No significant degenerative changes. IMPRESSION: No CT  evidence of acute abnormality. Cholelithiasis without CT evidence of acute cholecystitis. Diverticular disease. Electronically Signed   By: Gilmer Mor D.O.   On: 11/20/2018 10:24   US Abdomen Limited Ruq  Result Date: 11/20/2018 CLINICAL DATA:  Cholelithiasis and upper abdominal pain EXAM: ULTRASOUND ABDOMEN LIMITED RIGHT UPPER QUADRANT COMPARISON:  CT from earlier in the same day. FINDINGS: Gallbladder: Gallbladder is well distended and demonstrates multiple gallstones. No pericholecystic fluid or wall thickening is noted. Negative sonographic Eulah Pont sign is elicited. Common bile duct: Diameter: 3.3 mm. Liver: No focal lesion identified. Within normal limits in parenchymal echogenicity. Portal vein is patent on color Doppler imaging with normal direction of blood flow towards the liver. IMPRESSION: Cholelithiasis without complicating factors. Electronically Signed   By: Alcide Clever M.D.   On: 11/20/2018 11:35       Assessment and Plan:   1.  Painful jaundice: Likely secondary to cholelithiasis/?  Choledocholithiasis and might have passed gallstone.  No evidence of cholecystitis by imaging, no fever or white count.  CBD size normal.  Lipase normal.  Will request fractionated bilirubin and trend liver enzymes for spontaneous improvement which may indicate that he passed a gallstone.  If no improvement will require MRCP/ERCP.  Will notify GI of patient's arrival.  N.p.o./IV fluids.  Will defer antibiotics at this time low suspicion for cholangitis.  Pain medications (avoid acetaminophen products given elevated LFTs) as needed  2.  Marijuana abuse and possible induced gastritis: Patient counseled regarding recreational drug use.  IV Pepcid, antiemetics PRN.  3.  Small hiatal hernia/GERD: Pepcid   DVT prophylaxis: SCDs given low risk  Code Status: Full code  Family Communication: Discussed with patient. Health care proxy would be wife Consults called: GI Dr. Sheron Nightingale Admission status:   Patient admitted as observation as anticipated LOS less than 2 midnights if LFTs downtrend and normalized.   Addendum 9 pm: MRCP resulted positive for multiple stones in the CBD (at the intersection of cystic duct and CBD as well as distal CBD).  Notify GI.  Will keep n.p.o. after midnight for ERCP in a.m.  Will order empiric antibiotics given high risk of cholangitis.  Alessandra Bevels MD [facility] Pager [phone]  If 7PM-7AM, please contact night-coverage [email or web address] Password [facility]  11/20/2018, 4:51 PM

## 2018-11-20 NOTE — ED Notes (Signed)
Report called to Counselling psychologist at [facility]

## 2018-11-20 NOTE — Progress Notes (Signed)
43 yo M presents to [facility] with complaints of upper abdominal pain of 3 days duration. Associated with nausea and vomiting. Labs studies remarkable for elevated AST, ALT and T bili. CT abd shows gallstones. [place] unrevealing for CBD dilatation. GI contacted by ED provider. Dr Pascal Lux will see the patient in consultation.  Accepted to medsurg unit, obs status.  Please contact Dr Pascal Lux once patient arrives at [facility].

## 2018-11-20 NOTE — ED Provider Notes (Addendum)
[facility] EMERGENCY DEPARTMENT Provider Note   CSN: [id number] Arrival date & time: 11/20/18  8110    History   Chief Complaint Chief Complaint  Patient presents with  . Abdominal Pain    HPI Alejandro Willis is a 43 y.o. male.     HPI Patient is a 43 year old male who presents emergency department with 3 days of worsening upper abdominal pain.  Nausea and vomiting.  No vomiting today.  Remains nauseated.  Feels the majority of his discomfort in his upper abdomen.  No chest pain or shortness of breath.  Patient states similar symptoms back in 2016 that resolved with symptomatic management.  He was seen in the emergency department at that time and besides a small hiatal hernia had an otherwise normal CT of his abdomen.  No prior abdominal surgical history.  Denies back pain or flank pain.  No dysuria or urinary frequency.  Denies melena.  Does not know the color of his vomit but does not think it was blood.   History reviewed. No pertinent past medical history.  There are no active problems to display for this patient.   History reviewed. No pertinent surgical history.      Home Medications    Prior to Admission medications   Medication Sig Start Date End Date Taking? Authorizing Provider  ondansetron (ZOFRAN) 4 MG tablet Take 1 tablet (4 mg total) by mouth every 6 (six) hours. 07/22/15   Gilda Crease, MD  oxyCODONE-acetaminophen (PERCOCET) 5-325 MG tablet Take 1-2 tablets by mouth every 4 (four) hours as needed. 07/22/15   Gilda Crease, MD    Family History History reviewed. No pertinent family history.  Social History Social History   Tobacco Use  . Smoking status: Never Smoker  . Smokeless tobacco: Never Used  Substance Use Topics  . Alcohol use: No  . Drug use: Yes    Frequency: 7.0 times per week    Types: Marijuana     Allergies   Patient has no known allergies.   Review of Systems Review of Systems  All other  systems reviewed and are negative.    Physical Exam Updated Vital Signs BP 125/84 (BP Location: Right Arm)   Pulse 66   Temp 98.2 F (36.8 C) (Oral)   Resp 18   Ht 5\' 6"  (1.676 m)   Wt 90.7 kg   SpO2 99%   BMI 32.28 kg/m   Physical Exam Vitals signs and nursing note reviewed.  Constitutional:      Appearance: He is well-developed.  HENT:     Head: Normocephalic and atraumatic.  Neck:     Musculoskeletal: Normal range of motion.  Cardiovascular:     Rate and Rhythm: Normal rate and regular rhythm.     Heart sounds: Normal heart sounds.  Pulmonary:     Effort: Pulmonary effort is normal. No respiratory distress.     Breath sounds: Normal breath sounds.  Abdominal:     General: There is no distension.     Palpations: Abdomen is soft.     Tenderness: There is abdominal tenderness in the epigastric area.  Musculoskeletal: Normal range of motion.  Skin:    General: Skin is warm and dry.  Neurological:     Mental Status: He is alert and oriented to person, place, and time.  Psychiatric:        Judgment: Judgment normal.      ED Treatments / Results  Labs (all labs ordered are listed, but  only abnormal results are displayed) Labs Reviewed  CBC - Abnormal; Notable for the following components:      Result Value   RBC 6.31 (*)    MCV 78.9 (*)    MCH 24.4 (*)    All other components within normal limits  COMPREHENSIVE METABOLIC PANEL - Abnormal; Notable for the following components:   Potassium 3.2 (*)    Glucose, Bld 124 (*)    BUN 5 (*)    AST 430 (*)    ALT 770 (*)    Total Bilirubin 4.6 (*)    All other components within normal limits  URINALYSIS, ROUTINE W REFLEX MICROSCOPIC - Abnormal; Notable for the following components:   Specific Gravity, Urine <1.005 (*)    Bilirubin Urine MODERATE (*)    All other components within normal limits  LIPASE, BLOOD    EKG None  Radiology Ct Abdomen Pelvis W Contrast  Result Date: 11/20/2018 CLINICAL DATA:   43 year old male with a history of 2 days upper abdominal pain and vomiting EXAM: CT ABDOMEN AND PELVIS WITH CONTRAST TECHNIQUE: Multidetector CT imaging of the abdomen and pelvis was performed using the standard protocol following bolus administration of intravenous contrast. CONTRAST:  100mL OMNIPAQUE IOHEXOL 300 MG/ML SOLN, 15mL ISOVUE-300 IOPAMIDOL (ISOVUE-300) INJECTION 61% COMPARISON:  07/22/2015 FINDINGS: Lower chest: No acute finding Hepatobiliary: Unremarkable appearance of liver parenchyma. Calcified cholelithiasis at the dependent aspect of the gallbladder with no pericholecystic fluid or inflammatory changes. The course of the common bile duct unremarkable with no intrahepatic or extrahepatic biliary ductal dilatation. Pancreas: Unremarkable Spleen: Unremarkable Adrenals/Urinary Tract: Unremarkable appearance of the adrenal glands. No evidence of hydronephrosis of the right or left kidney. No nephrolithiasis. Unremarkable course of the bilateral ureters. Unremarkable appearance of the urinary bladder. Stomach/Bowel: Unremarkable stomach. Unremarkable small bowel. No abnormal distention. No transition point. Normal appendix. Diverticular disease without evidence of acute inflammatory changes. No significant stool burden. Vascular/Lymphatic: No atherosclerotic changes. No aneurysm. Proximal femoral arteries patent. No adenopathy. Reproductive: Unremarkable pelvic organs. Other: None Musculoskeletal: No acute displaced fracture. Scoliotic curvature again noted. No significant degenerative changes. IMPRESSION: No CT evidence of acute abnormality. Cholelithiasis without CT evidence of acute cholecystitis. Diverticular disease. Electronically Signed   By: Gilmer MorJaime  Wagner D.O.   On: 11/20/2018 10:24   [place] Abdomen Limited Ruq  Result Date: 11/20/2018 CLINICAL DATA:  Cholelithiasis and upper abdominal pain EXAM: ULTRASOUND ABDOMEN LIMITED RIGHT UPPER QUADRANT COMPARISON:  CT from earlier in the same day.  FINDINGS: Gallbladder: Gallbladder is well distended and demonstrates multiple gallstones. No pericholecystic fluid or wall thickening is noted. Negative sonographic Eulah PontMurphy sign is elicited. Common bile duct: Diameter: 3.3 mm. Liver: No focal lesion identified. Within normal limits in parenchymal echogenicity. Portal vein is patent on color Doppler imaging with normal direction of blood flow towards the liver. IMPRESSION: Cholelithiasis without complicating factors. Electronically Signed   By: Alcide CleverMark  Lukens M.D.   On: 11/20/2018 11:35    Procedures Procedures (including critical care time)  Medications Ordered in ED Medications  ondansetron (ZOFRAN) injection 4 mg (4 mg Intravenous Given 11/20/18 0913)  sodium chloride 0.9 % bolus 1,000 mL (1,000 mLs Intravenous New Bag/Given 11/20/18 0913)  morphine 4 MG/ML injection 4 mg (4 mg Intravenous Given 11/20/18 0914)     Initial Impression / Assessment and Plan / ED Course  I have reviewed the triage vital signs and the nursing notes.  Pertinent labs & imaging results that were available during my care of the patient were reviewed by  me and considered in my medical decision making (see chart for details).       Elevated LFTs and bilirubin in the setting of cholelithiasis seen on CT imaging.  Suspect choledocholithiasis.  Right upper quadrant ultrasound now.  Plan for admission, GI consultation, possible ERCP.    Differential Diagnosis: Gastritis versus cholecystitis versus choledocholithiasis versus gastric/duodenal ulcer Radiology Films Personally Reviewed: Personally viewed CT imaging.  Patient with evidence of cholelithiasis and no other intra-abdominal pathology.  Consultants: Gastroenterology: Dr Levora Angel            Hospitalist: [facility]  Collateral Information:  EMS: None  Old Records: Prior ER records reviewed including visit in 2016.  Abdominal CT reviewed at that time which demonstrated hiatal hernia  Family: No family  available  Lab Data Interpretation: Elevated LFTs and bilirubin concern for common bile duct obstruction.  Lipase is normal.  Mild hypokalemia of 3.2  12:23 PM Common bile duct normal at this time.  Still with nausea.  Pain improving.  Given elevation in LFTs patient will be admitted to the hospital.  GI consultation.  May benefit from MRCP.  Acute hepatitis panel will be added on. Transfer and admit to [facility]  12:35 PM I spoke to gastroenterology who agrees that if his LFTs do not improve he will benefit from MRCP.  He request that the inpatient service call him when he arrives to [facility] so that he can be seen in consultation.    Final Clinical Impressions(s) / ED Diagnoses   Final diagnoses:  Upper abdominal pain  Calculus of gallbladder with biliary obstruction but without cholecystitis    ED Discharge Orders    None       Azalia Bilis, MD 11/20/18 1224    Azalia Bilis, MD 11/20/18 1236

## 2018-11-21 DIAGNOSIS — K8042 Calculus of bile duct with acute cholecystitis without obstruction: Secondary | ICD-10-CM | POA: Diagnosis not present

## 2018-11-21 DIAGNOSIS — K8050 Calculus of bile duct without cholangitis or cholecystitis without obstruction: Secondary | ICD-10-CM | POA: Diagnosis not present

## 2018-11-21 DIAGNOSIS — R945 Abnormal results of liver function studies: Secondary | ICD-10-CM | POA: Diagnosis not present

## 2018-11-21 DIAGNOSIS — R110 Nausea: Secondary | ICD-10-CM | POA: Diagnosis not present

## 2018-11-21 DIAGNOSIS — E876 Hypokalemia: Secondary | ICD-10-CM | POA: Diagnosis present

## 2018-11-21 DIAGNOSIS — R1013 Epigastric pain: Secondary | ICD-10-CM | POA: Diagnosis not present

## 2018-11-21 DIAGNOSIS — K449 Diaphragmatic hernia without obstruction or gangrene: Secondary | ICD-10-CM | POA: Diagnosis not present

## 2018-11-21 DIAGNOSIS — R748 Abnormal levels of other serum enzymes: Secondary | ICD-10-CM | POA: Diagnosis not present

## 2018-11-21 DIAGNOSIS — R7989 Other specified abnormal findings of blood chemistry: Secondary | ICD-10-CM | POA: Diagnosis present

## 2018-11-21 DIAGNOSIS — K838 Other specified diseases of biliary tract: Secondary | ICD-10-CM | POA: Diagnosis not present

## 2018-11-21 DIAGNOSIS — R740 Nonspecific elevation of levels of transaminase and lactic acid dehydrogenase [LDH]: Secondary | ICD-10-CM | POA: Diagnosis not present

## 2018-11-21 DIAGNOSIS — F1210 Cannabis abuse, uncomplicated: Secondary | ICD-10-CM | POA: Diagnosis present

## 2018-11-21 DIAGNOSIS — E669 Obesity, unspecified: Secondary | ICD-10-CM | POA: Diagnosis present

## 2018-11-21 DIAGNOSIS — K810 Acute cholecystitis: Secondary | ICD-10-CM | POA: Diagnosis not present

## 2018-11-21 DIAGNOSIS — K219 Gastro-esophageal reflux disease without esophagitis: Secondary | ICD-10-CM

## 2018-11-21 DIAGNOSIS — Z6832 Body mass index (BMI) 32.0-32.9, adult: Secondary | ICD-10-CM | POA: Diagnosis not present

## 2018-11-21 DIAGNOSIS — K8010 Calculus of gallbladder with chronic cholecystitis without obstruction: Secondary | ICD-10-CM | POA: Diagnosis not present

## 2018-11-21 DIAGNOSIS — K812 Acute cholecystitis with chronic cholecystitis: Secondary | ICD-10-CM | POA: Diagnosis not present

## 2018-11-21 DIAGNOSIS — K8020 Calculus of gallbladder without cholecystitis without obstruction: Secondary | ICD-10-CM | POA: Diagnosis not present

## 2018-11-21 DIAGNOSIS — R7401 Elevation of levels of liver transaminase levels: Secondary | ICD-10-CM | POA: Diagnosis present

## 2018-11-21 DIAGNOSIS — K8067 Calculus of gallbladder and bile duct with acute and chronic cholecystitis with obstruction: Secondary | ICD-10-CM | POA: Diagnosis present

## 2018-11-21 DIAGNOSIS — Z79899 Other long term (current) drug therapy: Secondary | ICD-10-CM | POA: Diagnosis not present

## 2018-11-21 LAB — COMPREHENSIVE METABOLIC PANEL
ALT: 534 U/L — ABNORMAL HIGH (ref 0–44)
AST: 206 U/L — ABNORMAL HIGH (ref 15–41)
Albumin: 3.5 g/dL (ref 3.5–5.0)
Alkaline Phosphatase: 110 U/L (ref 38–126)
Anion gap: 8 (ref 5–15)
BUN: <5 mg/dL — ABNORMAL LOW (ref 6–20)
CO2: 25 mmol/L (ref 22–32)
Calcium: 8.7 mg/dL — ABNORMAL LOW (ref 8.9–10.3)
Chloride: 105 mmol/L (ref 98–111)
Creatinine, Ser: 0.76 mg/dL (ref 0.61–1.24)
GFR calc Af Amer: >60 mL/min (ref >60)
GFR calc non Af Amer: >60 mL/min (ref >60)
Glucose, Bld: 97 mg/dL (ref 70–99)
Potassium: 3.9 mmol/L (ref 3.5–5.1)
Sodium: 138 mmol/L (ref 135–145)
Total Bilirubin: 4.3 mg/dL — ABNORMAL HIGH (ref 0.3–1.2)
Total Protein: 6.8 g/dL (ref 6.5–8.1)

## 2018-11-21 LAB — HEPATITIS PANEL, ACUTE
HCV Ab: 0.1 s/co ratio (ref 0.0–0.9)
Hep A IgM: NEGATIVE
Hep B C IgM: NEGATIVE
Hepatitis B Surface Ag: NEGATIVE

## 2018-11-21 LAB — HEPATITIS B CORE ANTIBODY, IGM: Hep B C IgM: NEGATIVE

## 2018-11-21 LAB — HIV ANTIBODY (ROUTINE TESTING W REFLEX): HIV Screen 4th Generation wRfx: NONREACTIVE

## 2018-11-21 LAB — HEPATITIS B SURFACE ANTIGEN: Hepatitis B Surface Ag: NEGATIVE

## 2018-11-21 LAB — HEPATITIS C ANTIBODY: HCV Ab: 0.1 s/co ratio (ref 0.0–0.9)

## 2018-11-21 MED ORDER — SIMETHICONE 80 MG PO CHEW
80.0000 mg | CHEWABLE_TABLET | Freq: Once | ORAL | Status: AC
  Administered 2018-11-21: 20:00:00 80 mg via ORAL
  Filled 2018-11-21: qty 1

## 2018-11-21 NOTE — Plan of Care (Signed)
  Problem: Clinical Measurements: Goal: Ability to maintain clinical measurements within normal limits will improve Outcome: Progressing Goal: Will remain free from infection Outcome: Progressing Goal: Diagnostic test results will improve Outcome: Progressing   Problem: Nutrition: Goal: Adequate nutrition will be maintained Outcome: Progressing   Problem: Elimination: Goal: Will not experience complications related to bowel motility Outcome: Progressing Goal: Will not experience complications related to urinary retention Outcome: Progressing   Problem: Pain Managment: Goal: General experience of comfort will improve Outcome: Progressing   Problem: Safety: Goal: Ability to remain free from injury will improve Outcome: Progressing   Problem: Skin Integrity: Goal: Risk for impaired skin integrity will decrease Outcome: Progressing

## 2018-11-21 NOTE — Consult Note (Signed)
Subjective:   HPI  The patient is a 43 year old male who was admitted to the hospital because upper abdominal pain.  He had gone to the [facility] emergency department with this problem.  He had a CT scan showing gallstones.  His liver enzymes were elevated.  He was transferred here for further evaluation and treatment.  Hepatitis panel negative.  Here in this hospital he had an MRCP which showed gallstones and also evidence of distal CBD stones which were small.  At the present time patient feels fine.  Has no abdominal pain.  He is on Unasyn.  Review of Systems No chest pain or shortness of breath History reviewed. No pertinent past medical history. History reviewed. No pertinent surgical history. Social History   Socioeconomic History  . Marital status: Married    Spouse name: Not on file  . Number of children: Not on file  . Years of education: Not on file  . Highest education level: Not on file  Occupational History  . Not on file  Social Needs  . Financial resource strain: Not on file  . Food insecurity:    Worry: Not on file    Inability: Not on file  . Transportation needs:    Medical: Not on file    Non-medical: Not on file  Tobacco Use  . Smoking status: Never Smoker  . Smokeless tobacco: Never Used  Substance and Sexual Activity  . Alcohol use: No  . Drug use: Yes    Frequency: 7.0 times per week    Types: Marijuana  . Sexual activity: Not on file  Lifestyle  . Physical activity:    Days per week: Not on file    Minutes per session: Not on file  . Stress: Not on file  Relationships  . Social connections:    Talks on phone: Not on file    Gets together: Not on file    Attends religious service: Not on file    Active member of club or organization: Not on file    Attends meetings of clubs or organizations: Not on file    Relationship status: Not on file  . Intimate partner violence:    Fear of current or ex partner: Not on file   Emotionally abused: Not on file    Physically abused: Not on file    Forced sexual activity: Not on file  Other Topics Concern  . Not on file  Social History Narrative  . Not on file   family history is not on file.  Current Facility-Administered Medications:  .  Ampicillin-Sulbactam (UNASYN) 3 g in sodium chloride 0.9 % 100 mL IVPB, 3 g, Intravenous, Q6H, Kamineni, Neelima, MD, Last Rate: 200 mL/hr at 11/21/18 0642, 3 g at 11/21/18 0642 .  bisacodyl (DULCOLAX) EC tablet 5 mg, 5 mg, Oral, Daily PRN, Lajuana Ripple, Neelima, MD .  famotidine (PEPCID) IVPB 20 mg premix, 20 mg, Intravenous, Q12H, Kamineni, Neelima, MD, Last Rate: 100 mL/hr at 11/20/18 2236, 20 mg at 11/20/18 2236 .  hydrOXYzine (ATARAX/VISTARIL) tablet 10 mg, 10 mg, Oral, TID PRN, Alessandra Bevels, MD .  lactated ringers infusion, , Intravenous, Continuous, Kamineni, Neelima, MD, Last Rate: 125 mL/hr at 11/21/18 0332 .  ondansetron (ZOFRAN) injection 4 mg, 4 mg, Intravenous, Q6H PRN, Kamineni, Neelima, MD .  oxyCODONE (Oxy IR/ROXICODONE) immediate release tablet 5 mg, 5 mg, Oral, Q4H PRN, Alessandra Bevels, MD, 5 mg at 11/21/18 0335 Allergies  Allergen Reactions  . Other Anaphylaxis  Nuts  . Shellfish Allergy Anaphylaxis     Objective:     BP 109/65 (BP Location: Left Arm)   Pulse 76   Temp 98.2 F (36.8 C) (Oral)   Resp 16   Ht 5\' 6"  (1.676 m)   Wt 90.7 kg   SpO2 97%   BMI 32.28 kg/m   Alert and oriented  No acute distress  Heart regular rhythm no murmurs  Lungs clear  Abdomen soft and nontender  Laboratory No components found for: D1    Assessment:     Gallstones  Choledocholithiasis      Plan:     The patient will need ERCP with sphincterotomy and stone extraction of the distal CBD stones.  He will then need cholecystectomy.  Procedure of ERCP was discussed with patient along with the potential risks of bleeding, infection, perforation, and pancreatitis.  He understands.  We will schedule  this for Monday.  I do not feel it is emergent.  We will allow clear liquids at this time.

## 2018-11-21 NOTE — Progress Notes (Addendum)
[facility] PROGRESS NOTE  Alejandro Willis MPN:[id number] DOB: 1976/03/21 DOA: 11/20/2018   PCP: Patient, No Pcp Per     Assessment/Plan: Active Problems:   Elevated LFTs   Choledocholithiasis    43 y.o. male with no significant past medical history presents with complaints of abdominal pain for 2 days.  Patient states he has had issues with on and off GI symptoms/abdominal discomfort for couple of years.  In 2016 he underwent CT abdomen which only showed small hiatal hernia, otherwise normal.  He admits to smoking marijuana every day. Work-up at outside ED revealed elevated LFTs with ALT in 700, AST in 200s and total bili at 4. Denies any history of gallstones but CT abdomen obtained at [facility] revealed gallstones, also confirmed by ultrasonogram without any evidence of CBD dilatation or pericholecystic fluid.  Patient is now admitted at [facility] for possible acute choledocholithiasis and transaminitis  Assessment and plan 1.   Acute choledocholithiasis/acute cholecystitis: Patient has elevated AST/AL, predominantly direct hyperbilirubinemia consistent with biliary obstruction hepatocellular injury, likely secondary to cholelithiasis/?  Choledocholithiasis and might have passed gallstone.  No evidence of cholecystitis by imaging, no fever or white count.  CBD size normal.  Lipase normal.    MRI showed numerous tiny gallstones and several calculi seen in the cystic duct.     No radiographic evidence of cholecystitis on MRI, HIDA scan pending.  GI notified, Dr Evette Cristal who recommends ERCP) cholecystectomy .  Patient currently on Unasyn. Pain medications (avoid acetaminophen products given elevated LFTs) as needed  2.  Marijuana abuse and possible induced gastritis: Patient counseled regarding recreational drug use.  IV Pepcid, antiemetics PRN.  3.  Small hiatal hernia/GERD: Pepcid   DVT prophylaxsis SCDs  Code Status: Full code   Family Communication: Discussed in  detail with the patient, all imaging results, lab results explained to the patient   Disposition Plan: Patient meets inpatient criteria given abnormal liver function and pending further GI evaluation.  I do not anticipate that he will be discharged in the next 48 hours      Consultants:  Gastroenterology  Procedures:  MRCP  Antibiotics: Anti-infectives (From admission, onward)   Start     Dose/Rate Route Frequency Ordered Stop   11/20/18 2245  Ampicillin-Sulbactam (UNASYN) 3 g in sodium chloride 0.9 % 100 mL IVPB     3 g 200 mL/hr over 30 Minutes Intravenous Every 6 hours 11/20/18 2224     11/20/18 2230  levofloxacin (LEVAQUIN) IVPB 500 mg  Status:  Discontinued     500 mg 100 mL/hr over 60 Minutes Intravenous Every 24 hours 11/20/18 2223 11/20/18 2223         HPI/Subjective: Complains of right upper quadrant pain, constant pain for the last 2 days, has had intermittent pain over the last 6 months, denies any nausea vomiting this morning, afebrile  Objective: Vitals:   11/20/18 1543 11/20/18 1631 11/20/18 2030 11/21/18 0641  BP: 122/78 124/80 (!) 118/44 109/65  Pulse: 68 69 65 76  Resp:  18 17 16   Temp: 98.2 F (36.8 C) 98.1 F (36.7 C) 98.2 F (36.8 C) 98.2 F (36.8 C)  TempSrc: Oral Oral Oral Oral  SpO2: 99% 96% 97% 97%  Weight:      Height:        Intake/Output Summary (Last 24 hours) at 11/21/2018 0837 Last data filed at 11/20/2018 0086 Gross per 24 hour  Intake 163.6 ml  Output -  Net  163.6 ml    Exam:  Examination:  General exam: Appears calm and comfortable  Respiratory system: Clear to auscultation. Respiratory effort normal. Cardiovascular system: S1 & S2 heard, RRR. No JVD, murmurs, rubs, gallops or clicks. No pedal edema. Gastrointestinal system: Abdomen is nondistended, soft and  Tender to palpation in the right upper quadrant. No organomegaly or masses felt. Normal bowel sounds heard. Central nervous system: Alert and oriented. No focal  neurological deficits. Extremities: Symmetric 5 x 5 power. Skin: No rashes, lesions or ulcers Psychiatry: Judgement and insight appear normal. Mood & affect appropriate.     Data Reviewed: I have personally reviewed following labs and imaging studies  Micro Results No results found for this or any previous visit (from the past 240 hour(s)).  Radiology Reports Ct Abdomen Pelvis W Contrast  Result Date: 11/20/2018 CLINICAL DATA:  43 year old male with a history of 2 days upper abdominal pain and vomiting EXAM: CT ABDOMEN AND PELVIS WITH CONTRAST TECHNIQUE: Multidetector CT imaging of the abdomen and pelvis was performed using the standard protocol following bolus administration of intravenous contrast. CONTRAST:  OMNIPAQUE IOHEXOL 300 MG/ML SOLN, 15mL ISOVUE-300 IOPAMIDOL (ISOVUE-300) INJECTION 61% COMPARISON:  07/22/2015 FINDINGS: Lower chest: No acute finding Hepatobiliary: Unremarkable appearance of liver parenchyma. Calcified cholelithiasis at the dependent aspect of the gallbladder with no pericholecystic fluid or inflammatory changes. The course of the common bile duct unremarkable with no intrahepatic or extrahepatic biliary ductal dilatation. Pancreas: Unremarkable Spleen: Unremarkable Adrenals/Urinary Tract: Unremarkable appearance of the adrenal glands. No evidence of hydronephrosis of the right or left kidney. No nephrolithiasis. Unremarkable course of the bilateral ureters. Unremarkable appearance of the urinary bladder. Stomach/Bowel: Unremarkable stomach. Unremarkable small bowel. No abnormal distention. No transition point. Normal appendix. Diverticular disease without evidence of acute inflammatory changes. No significant stool burden. Vascular/Lymphatic: No atherosclerotic changes. No aneurysm. Proximal femoral arteries patent. No adenopathy. Reproductive: Unremarkable pelvic organs. Other: None Musculoskeletal: No acute displaced fracture. Scoliotic curvature again noted. No  significant degenerative changes. IMPRESSION: No CT evidence of acute abnormality. Cholelithiasis without CT evidence of acute cholecystitis. Diverticular disease. Electronically Signed   By: Gilmer Mor D.O.   On: 11/20/2018 10:24   Mr 3d Recon At Scanner  Result Date: 11/21/2018 CLINICAL DATA:  Abdominal pain for several months.  Cholelithiasis. EXAM: MRI ABDOMEN WITHOUT AND WITH CONTRAST (INCLUDING MRCP) TECHNIQUE: Multiplanar multisequence MR imaging of the abdomen was performed both before and after the administration of intravenous contrast. Heavily T2-weighted images of the biliary and pancreatic ducts were obtained, and three-dimensional MRCP images were rendered by post processing. CONTRAST:  10 mL Gadavist COMPARISON:  CT on 11/20/2018 FINDINGS: Lower chest: No acute findings. Hepatobiliary: No hepatic masses identified. Numerous tiny gallstones are seen, however there is no evidence of gallbladder wall thickening or pericholecystic inflammatory changes. Several small calculi are seen within the cystic duct. There is no evidence of biliary ductal dilatation which limits evaluation of the common bile duct, however there are several subtle filling defects in the distal common bile duct suspicious for choledocholithiasis. Pancreas: No mass or inflammatory changes. No evidence of pancreatic ductal dilatation. Spleen:  Within normal limits in size and appearance. Adrenals/Urinary Tract: No masses identified. No evidence of hydronephrosis. Stomach/Bowel: Visualized portion unremarkable. Vascular/Lymphatic: No pathologically enlarged lymph nodes identified. No abdominal aortic aneurysm. Other:  None. Musculoskeletal:  No suspicious bone lesions identified. IMPRESSION: Numerous tiny gallstones and several calculi seen in the cystic duct. No radiographic evidence of cholecystitis. No evidence of biliary ductal dilatation, however  several tiny calculi are suspected in the distal common bile duct. Electronically  Signed   By: Myles RosenthalJohn  Stahl M.D.   On: 11/21/2018 08:31   Mr Abdomen Mrcp Vivien RossettiW Wo Contast  Result Date: 11/21/2018 CLINICAL DATA:  Abdominal pain for several months.  Cholelithiasis. EXAM: MRI ABDOMEN WITHOUT AND WITH CONTRAST (INCLUDING MRCP) TECHNIQUE: Multiplanar multisequence MR imaging of the abdomen was performed both before and after the administration of intravenous contrast. Heavily T2-weighted images of the biliary and pancreatic ducts were obtained, and three-dimensional MRCP images were rendered by post processing. CONTRAST:  10 mL Gadavist COMPARISON:  CT on 11/20/2018 FINDINGS: Lower chest: No acute findings. Hepatobiliary: No hepatic masses identified. Numerous tiny gallstones are seen, however there is no evidence of gallbladder wall thickening or pericholecystic inflammatory changes. Several small calculi are seen within the cystic duct. There is no evidence of biliary ductal dilatation which limits evaluation of the common bile duct, however there are several subtle filling defects in the distal common bile duct suspicious for choledocholithiasis. Pancreas: No mass or inflammatory changes. No evidence of pancreatic ductal dilatation. Spleen:  Within normal limits in size and appearance. Adrenals/Urinary Tract: No masses identified. No evidence of hydronephrosis. Stomach/Bowel: Visualized portion unremarkable. Vascular/Lymphatic: No pathologically enlarged lymph nodes identified. No abdominal aortic aneurysm. Other:  None. Musculoskeletal:  No suspicious bone lesions identified. IMPRESSION: Numerous tiny gallstones and several calculi seen in the cystic duct. No radiographic evidence of cholecystitis. No evidence of biliary ductal dilatation, however several tiny calculi are suspected in the distal common bile duct. Electronically Signed   By: Myles RosenthalJohn  Stahl M.D.   On: 11/21/2018 08:31   [place] Abdomen Limited Ruq  Result Date: 11/20/2018 CLINICAL DATA:  Cholelithiasis and upper abdominal pain EXAM:  ULTRASOUND ABDOMEN LIMITED RIGHT UPPER QUADRANT COMPARISON:  CT from earlier in the same day. FINDINGS: Gallbladder: Gallbladder is well distended and demonstrates multiple gallstones. No pericholecystic fluid or wall thickening is noted. Negative sonographic Eulah PontMurphy sign is elicited. Common bile duct: Diameter: 3.3 mm. Liver: No focal lesion identified. Within normal limits in parenchymal echogenicity. Portal vein is patent on color Doppler imaging with normal direction of blood flow towards the liver. IMPRESSION: Cholelithiasis without complicating factors. Electronically Signed   By: Alcide CleverMark  Lukens M.D.   On: 11/20/2018 11:35     CBC Recent Labs  Lab 11/20/18 0910  WBC 5.7  HGB 15.4  HCT 49.8  PLT 205  MCV 78.9*  MCH 24.4*  MCHC 30.9  RDW 15.2    Chemistries  Recent Labs  Lab 11/20/18 0910 11/20/18 1737 11/20/18 2215 11/21/18 0535  NA 136  --   --  138  K 3.2*  --  3.8 3.9  CL 102  --   --  105  CO2 26  --   --  25  GLUCOSE 124*  --   --  97  BUN 5*  --   --  <5*  CREATININE 0.81  --   --  0.76  CALCIUM 9.0  --   --  8.7*  AST 430* 337*  --  206*  ALT 770* 691*  --  534*  ALKPHOS 112 118  --  110  BILITOT 4.6* 5.0*  --  4.3*   ------------------------------------------------------------------------------------------------------------------ estimated creatinine clearance is 128.2 mL/min (by C-G formula based on SCr of 0.76 mg/dL). ------------------------------------------------------------------------------------------------------------------ No results for input(s): HGBA1C in the last 72 hours. ------------------------------------------------------------------------------------------------------------------ No results for input(s): CHOL, HDL, LDLCALC, TRIG, CHOLHDL, LDLDIRECT in the last 72 hours. ------------------------------------------------------------------------------------------------------------------  No results for input(s): TSH, T4TOTAL, T3FREE, THYROIDAB in  the last 72 hours.  Invalid input(s): FREET3 ------------------------------------------------------------------------------------------------------------------ No results for input(s): VITAMINB12, FOLATE, FERRITIN, TIBC, IRON, RETICCTPCT in the last 72 hours.  Coagulation profile No results for input(s): INR, PROTIME in the last 168 hours.  No results for input(s): DDIMER in the last 72 hours.  Cardiac Enzymes No results for input(s): CKMB, TROPONINI, MYOGLOBIN in the last 168 hours.  Invalid input(s): CK ------------------------------------------------------------------------------------------------------------------ Invalid input(s): POCBNP   CBG: No results for input(s): GLUCAP in the last 168 hours.     Studies: Ct Abdomen Pelvis W Contrast  Result Date: 11/20/2018 CLINICAL DATA:  43 year old male with a history of 2 days upper abdominal pain and vomiting EXAM: CT ABDOMEN AND PELVIS WITH CONTRAST TECHNIQUE: Multidetector CT imaging of the abdomen and pelvis was performed using the standard protocol following bolus administration of intravenous contrast. CONTRAST:  OMNIPAQUE IOHEXOL 300 MG/ML SOLN, 15mL ISOVUE-300 IOPAMIDOL (ISOVUE-300) INJECTION 61% COMPARISON:  07/22/2015 FINDINGS: Lower chest: No acute finding Hepatobiliary: Unremarkable appearance of liver parenchyma. Calcified cholelithiasis at the dependent aspect of the gallbladder with no pericholecystic fluid or inflammatory changes. The course of the common bile duct unremarkable with no intrahepatic or extrahepatic biliary ductal dilatation. Pancreas: Unremarkable Spleen: Unremarkable Adrenals/Urinary Tract: Unremarkable appearance of the adrenal glands. No evidence of hydronephrosis of the right or left kidney. No nephrolithiasis. Unremarkable course of the bilateral ureters. Unremarkable appearance of the urinary bladder. Stomach/Bowel: Unremarkable stomach. Unremarkable small bowel. No abnormal distention. No  transition point. Normal appendix. Diverticular disease without evidence of acute inflammatory changes. No significant stool burden. Vascular/Lymphatic: No atherosclerotic changes. No aneurysm. Proximal femoral arteries patent. No adenopathy. Reproductive: Unremarkable pelvic organs. Other: None Musculoskeletal: No acute displaced fracture. Scoliotic curvature again noted. No significant degenerative changes. IMPRESSION: No CT evidence of acute abnormality. Cholelithiasis without CT evidence of acute cholecystitis. Diverticular disease. Electronically Signed   By: Gilmer Mor D.O.   On: 11/20/2018 10:24   Mr 3d Recon At Scanner  Result Date: 11/21/2018 CLINICAL DATA:  Abdominal pain for several months.  Cholelithiasis. EXAM: MRI ABDOMEN WITHOUT AND WITH CONTRAST (INCLUDING MRCP) TECHNIQUE: Multiplanar multisequence MR imaging of the abdomen was performed both before and after the administration of intravenous contrast. Heavily T2-weighted images of the biliary and pancreatic ducts were obtained, and three-dimensional MRCP images were rendered by post processing. CONTRAST:  10 mL Gadavist COMPARISON:  CT on 11/20/2018 FINDINGS: Lower chest: No acute findings. Hepatobiliary: No hepatic masses identified. Numerous tiny gallstones are seen, however there is no evidence of gallbladder wall thickening or pericholecystic inflammatory changes. Several small calculi are seen within the cystic duct. There is no evidence of biliary ductal dilatation which limits evaluation of the common bile duct, however there are several subtle filling defects in the distal common bile duct suspicious for choledocholithiasis. Pancreas: No mass or inflammatory changes. No evidence of pancreatic ductal dilatation. Spleen:  Within normal limits in size and appearance. Adrenals/Urinary Tract: No masses identified. No evidence of hydronephrosis. Stomach/Bowel: Visualized portion unremarkable. Vascular/Lymphatic: No pathologically enlarged  lymph nodes identified. No abdominal aortic aneurysm. Other:  None. Musculoskeletal:  No suspicious bone lesions identified. IMPRESSION: Numerous tiny gallstones and several calculi seen in the cystic duct. No radiographic evidence of cholecystitis. No evidence of biliary ductal dilatation, however several tiny calculi are suspected in the distal common bile duct. Electronically Signed   By: Myles Rosenthal M.D.   On: 11/21/2018 08:31   Mr Abdomen Mrcp W Wo Contast  Result Date: 11/21/2018 CLINICAL DATA:  Abdominal pain for several months.  Cholelithiasis. EXAM: MRI ABDOMEN WITHOUT AND WITH CONTRAST (INCLUDING MRCP) TECHNIQUE: Multiplanar multisequence MR imaging of the abdomen was performed both before and after the administration of intravenous contrast. Heavily T2-weighted images of the biliary and pancreatic ducts were obtained, and three-dimensional MRCP images were rendered by post processing. CONTRAST:  10 mL Gadavist COMPARISON:  CT on 11/20/2018 FINDINGS: Lower chest: No acute findings. Hepatobiliary: No hepatic masses identified. Numerous tiny gallstones are seen, however there is no evidence of gallbladder wall thickening or pericholecystic inflammatory changes. Several small calculi are seen within the cystic duct. There is no evidence of biliary ductal dilatation which limits evaluation of the common bile duct, however there are several subtle filling defects in the distal common bile duct suspicious for choledocholithiasis. Pancreas: No mass or inflammatory changes. No evidence of pancreatic ductal dilatation. Spleen:  Within normal limits in size and appearance. Adrenals/Urinary Tract: No masses identified. No evidence of hydronephrosis. Stomach/Bowel: Visualized portion unremarkable. Vascular/Lymphatic: No pathologically enlarged lymph nodes identified. No abdominal aortic aneurysm. Other:  None. Musculoskeletal:  No suspicious bone lesions identified. IMPRESSION: Numerous tiny gallstones and several  calculi seen in the cystic duct. No radiographic evidence of cholecystitis. No evidence of biliary ductal dilatation, however several tiny calculi are suspected in the distal common bile duct. Electronically Signed   By: Myles Rosenthal M.D.   On: 11/21/2018 08:31   [place] Abdomen Limited Ruq  Result Date: 11/20/2018 CLINICAL DATA:  Cholelithiasis and upper abdominal pain EXAM: ULTRASOUND ABDOMEN LIMITED RIGHT UPPER QUADRANT COMPARISON:  CT from earlier in the same day. FINDINGS: Gallbladder: Gallbladder is well distended and demonstrates multiple gallstones. No pericholecystic fluid or wall thickening is noted. Negative sonographic Eulah Pont sign is elicited. Common bile duct: Diameter: 3.3 mm. Liver: No focal lesion identified. Within normal limits in parenchymal echogenicity. Portal vein is patent on color Doppler imaging with normal direction of blood flow towards the liver. IMPRESSION: Cholelithiasis without complicating factors. Electronically Signed   By: Alcide Clever M.D.   On: 11/20/2018 11:35      No results found for: HGBA1C Lab Results  Component Value Date   CREATININE 0.76 11/21/2018       Scheduled Meds: Continuous Infusions: . ampicillin-sulbactam (UNASYN) IV 3 g (11/21/18 5110)  . famotidine (PEPCID) IV 20 mg (11/20/18 2236)  . lactated ringers 125 mL/hr at 11/21/18 0332     LOS: 0 days    Time spent: >30 MINS    Richarda Overlie  [facility] Pager [phone]. If 7PM-7AM, please contact night-coverage at [email or web address], password [facility] 11/21/2018, 8:37 AM  LOS: 0 days

## 2018-11-22 DIAGNOSIS — R740 Nonspecific elevation of levels of transaminase and lactic acid dehydrogenase [LDH]: Secondary | ICD-10-CM

## 2018-11-22 DIAGNOSIS — K810 Acute cholecystitis: Secondary | ICD-10-CM

## 2018-11-22 LAB — CBC
HCT: 44.2 % (ref 39.0–52.0)
Hemoglobin: 13.6 g/dL (ref 13.0–17.0)
MCH: 24.8 pg — ABNORMAL LOW (ref 26.0–34.0)
MCHC: 30.8 g/dL (ref 30.0–36.0)
MCV: 80.5 fL (ref 80.0–100.0)
Platelets: 193 10*3/uL (ref 150–400)
RBC: 5.49 MIL/uL (ref 4.22–5.81)
RDW: 15.1 % (ref 11.5–15.5)
WBC: 4.6 10*3/uL (ref 4.0–10.5)
nRBC: 0 % (ref 0.0–0.2)

## 2018-11-22 LAB — COMPREHENSIVE METABOLIC PANEL
ALT: 440 U/L — ABNORMAL HIGH (ref 0–44)
AST: 149 U/L — ABNORMAL HIGH (ref 15–41)
Albumin: 3.7 g/dL (ref 3.5–5.0)
Alkaline Phosphatase: 121 U/L (ref 38–126)
Anion gap: 8 (ref 5–15)
BUN: <5 mg/dL — ABNORMAL LOW (ref 6–20)
CO2: 27 mmol/L (ref 22–32)
Calcium: 8.8 mg/dL — ABNORMAL LOW (ref 8.9–10.3)
Chloride: 103 mmol/L (ref 98–111)
Creatinine, Ser: 0.89 mg/dL (ref 0.61–1.24)
GFR calc Af Amer: >60 mL/min (ref >60)
GFR calc non Af Amer: >60 mL/min (ref >60)
Glucose, Bld: 94 mg/dL (ref 70–99)
Potassium: 3.6 mmol/L (ref 3.5–5.1)
Sodium: 138 mmol/L (ref 135–145)
Total Bilirubin: 4.4 mg/dL — ABNORMAL HIGH (ref 0.3–1.2)
Total Protein: 7 g/dL (ref 6.5–8.1)

## 2018-11-22 NOTE — Progress Notes (Signed)
[facility] Progress Note  Subjective: The patient is doing well and feels fine. No abdominal pain.  Objective: Vital signs in last 24 hours: Temp:  [97.5 F (36.4 C)-98.3 F (36.8 C)] 97.5 F (36.4 C) (04/05 0500) Pulse Rate:  [64-79] 64 (04/05 0500) Resp:  [16-20] 16 (04/05 0500) BP: (124-132)/(79-94) 132/87 (04/05 0500) SpO2:  [97 %-99 %] 97 % (04/05 0500) Weight change:    PE:  No distress  Lab Results: Results for orders placed or performed during the hospital encounter of 11/20/18 (from the past 24 hour(s))  Comprehensive metabolic panel     Status: Abnormal   Collection Time: 11/22/18  5:29 AM  Result Value Ref Range   Sodium 138 135 - 145 mmol/L   Potassium 3.6 3.5 - 5.1 mmol/L   Chloride 103 98 - 111 mmol/L   CO2 27 22 - 32 mmol/L   Glucose, Bld 94 70 - 99 mg/dL   BUN <5 (L) 6 - 20 mg/dL   Creatinine, Ser 1.94 0.61 - 1.24 mg/dL   Calcium 8.8 (L) 8.9 - 10.3 mg/dL   Total Protein 7.0 6.5 - 8.1 g/dL   Albumin 3.7 3.5 - 5.0 g/dL   AST 174 (H) 15 - 41 U/L   ALT 440 (H) 0 - 44 U/L   Alkaline Phosphatase 121 38 - 126 U/L   Total Bilirubin 4.4 (H) 0.3 - 1.2 mg/dL   GFR calc non Af Amer >60 >60 mL/min   GFR calc Af Amer >60 >60 mL/min   Anion gap 8 5 - 15  CBC     Status: Abnormal   Collection Time: 11/22/18  5:29 AM  Result Value Ref Range   WBC 4.6 4.0 - 10.5 K/uL   RBC 5.49 4.22 - 5.81 MIL/uL   Hemoglobin 13.6 13.0 - 17.0 g/dL   HCT 08.1 44.8 - 18.5 %   MCV 80.5 80.0 - 100.0 fL   MCH 24.8 (L) 26.0 - 34.0 pg   MCHC 30.8 30.0 - 36.0 g/dL   RDW 63.1 49.7 - 02.6 %   Platelets 193 150 - 400 K/uL   nRBC 0.0 0.0 - 0.2 %    Studies/Results: No results found.    Assessment: Choledocholithiasis  Gallstones  Plan:   ERCP tomorrow. Surgical consult for cholecystectomy after ERCP.    Gwenevere Abbot 11/22/2018, 11:41 AM  Pager: [phone] If no answer or after 5 PM call [phone]

## 2018-11-22 NOTE — Consult Note (Signed)
**Note Alejandro-Identified via Obfuscation** Reason for Consult: gallstones Referring Physician: Jodean Willis  Alejandro Willis is an 43 y.o. male.  HPI: 43 yo male with 5 days of epigastric pain. Pain came after eating hot dogs. He has had similar symptoms over the past 2 years but not as severe. It lasted several hours so he went to the ER. He has nausea and vomiting. He denies diarrhea. He denied fevers. Since being admitted he was found to have hyperbilirubinemia with concern for choledocholithiasis on MRCP.  History reviewed. No pertinent past medical history.  History reviewed. No pertinent surgical history.  History reviewed. No pertinent family history.  Social History:  reports that he has never smoked. He has never used smokeless tobacco. He reports current drug use. Frequency: 7.00 times per week. Drug: Marijuana. He reports that he does not drink alcohol.  Allergies:  Allergies  Allergen Reactions  . Other Anaphylaxis    Nuts  . Shellfish Allergy Anaphylaxis    Medications: I have reviewed the patient's current medications.  Results for orders placed or performed during the hospital encounter of 11/20/18 (from the past 48 hour(s))  Potassium     Status: None   Collection Time: 11/20/18 10:15 PM  Result Value Ref Range   Potassium 3.8 3.5 - 5.1 mmol/L    Comment: Performed at [facility], [address], [place], [place] [address]  Comprehensive metabolic panel     Status: Abnormal   Collection Time: 11/21/18  5:35 AM  Result Value Ref Range   Sodium 138 135 - 145 mmol/L   Potassium 3.9 3.5 - 5.1 mmol/L   Chloride 105 98 - 111 mmol/L   CO2 25 22 - 32 mmol/L   Glucose, Bld 97 70 - 99 mg/dL   BUN <5 (L) 6 - 20 mg/dL   Creatinine, Ser 9.56 0.61 - 1.24 mg/dL   Calcium 8.7 (L) 8.9 - 10.3 mg/dL   Total Protein 6.8 6.5 - 8.1 g/dL   Albumin 3.5 3.5 - 5.0 g/dL   AST 213 (H) 15 - 41 U/L   ALT 534 (H) 0 - 44 U/L   Alkaline Phosphatase 110 38 - 126 U/L   Total Bilirubin 4.3 (H) 0.3 - 1.2  mg/dL   GFR calc non Af Amer >60 >60 mL/min   GFR calc Af Amer >60 >60 mL/min   Anion gap 8 5 - 15    Comment: Performed at [facility], [address], [place], [place] [address]  Comprehensive metabolic panel     Status: Abnormal   Collection Time: 11/22/18  5:29 AM  Result Value Ref Range   Sodium 138 135 - 145 mmol/L   Potassium 3.6 3.5 - 5.1 mmol/L   Chloride 103 98 - 111 mmol/L   CO2 27 22 - 32 mmol/L   Glucose, Bld 94 70 - 99 mg/dL   BUN <5 (L) 6 - 20 mg/dL   Creatinine, Ser 8.46 0.61 - 1.24 mg/dL   Calcium 8.8 (L) 8.9 - 10.3 mg/dL   Total Protein 7.0 6.5 - 8.1 g/dL   Albumin 3.7 3.5 - 5.0 g/dL   AST 962 (H) 15 - 41 U/L   ALT 440 (H) 0 - 44 U/L   Alkaline Phosphatase 121 38 - 126 U/L   Total Bilirubin 4.4 (H) 0.3 - 1.2 mg/dL   GFR calc non Af Amer >60 >60 mL/min   GFR calc Af Amer >60 >60 mL/min   Anion gap 8 5 - 15  Comment: Performed at [facility], [address] [place], [place] [address]  CBC     Status: Abnormal   Collection Time: 11/22/18  5:29 AM  Result Value Ref Range   WBC 4.6 4.0 - 10.5 K/uL   RBC 5.49 4.22 - 5.81 MIL/uL   Hemoglobin 13.6 13.0 - 17.0 g/dL   HCT 96.2 95.2 - 84.1 %   MCV 80.5 80.0 - 100.0 fL   MCH 24.8 (L) 26.0 - 34.0 pg   MCHC 30.8 30.0 - 36.0 g/dL   RDW 32.4 40.1 - 02.7 %   Platelets 193 150 - 400 K/uL   nRBC 0.0 0.0 - 0.2 %    Comment: Performed at [facility], [address], [place], [place] [address]    No results found.  Review of Systems  Constitutional: Negative for chills and fever.  HENT: Negative for hearing loss.   Eyes: Negative for blurred vision and double vision.  Respiratory: Negative for cough and hemoptysis.   Cardiovascular: Negative for chest pain and palpitations.  Gastrointestinal: Positive for abdominal pain, nausea and vomiting.  Genitourinary: Negative for dysuria and urgency.  Musculoskeletal: Negative for myalgias and neck pain.  Skin:  Negative for itching and rash.  Neurological: Negative for dizziness, tingling and headaches.  Endo/Heme/Allergies: Does not bruise/bleed easily.  Psychiatric/Behavioral: Negative for depression and suicidal ideas.   Blood pressure 129/83, pulse 72, temperature 98.7 F (37.1 C), temperature source Oral, resp. rate (!) 22, height 5\' 6"  (1.676 m), weight 90.7 kg, SpO2 97 %. Physical Exam  Vitals reviewed. Constitutional: He is oriented to person, place, and time. He appears well-developed and well-nourished.  HENT:  Head: Normocephalic and atraumatic.  Eyes: Pupils are equal, round, and reactive to light. Conjunctivae and EOM are normal.  Neck: Normal range of motion. Neck supple.  Cardiovascular: Normal rate and regular rhythm.  Respiratory: Effort normal.  GI: Soft. Bowel sounds are normal. He exhibits no distension. There is abdominal tenderness in the epigastric area.  Musculoskeletal: Normal range of motion.  Neurological: He is alert and oriented to person, place, and time.  Skin: Skin is warm and dry.  Psychiatric: He has a normal mood and affect. His behavior is normal.    Assessment/Plan: 43 yo male with concern for choledocholithiasis planning for ERCP tomorrow. I think he should have his gallbladder removed during this procedure and may be able to have it tomorrow after ERCP. -We discussed the etiology of her pain, we discussed treatment options and recommended surgery. We discussed details of surgery including general anesthesia, laparoscopic approach, identification of cystic duct and common bile duct. Ligation of cystic duct and cystic artery. Possible need for intraoperative cholangiogram or open procedure. Possible risks of common bile duct injury, liver injury, cystic duct leak, bleeding, infection, post-cholecystectomy syndrome. The patient showed good understanding and all questions were answered -NPO after midnight -perioperative antibiotics  Alejandro Willis  11/22/2018, 8:57 PM

## 2018-11-22 NOTE — Plan of Care (Signed)
  Problem: Clinical Measurements: Goal: Ability to maintain clinical measurements within normal limits will improve Outcome: Progressing Goal: Will remain free from infection Outcome: Progressing   Problem: Activity: Goal: Risk for activity intolerance will decrease Outcome: Progressing   Problem: Elimination: Goal: Will not experience complications related to bowel motility Outcome: Progressing Goal: Will not experience complications related to urinary retention Outcome: Progressing   Problem: Nutrition: Goal: Adequate nutrition will be maintained Outcome: Not Progressing Note:  Clear liquid diet, NPO after midnight

## 2018-11-22 NOTE — H&P (View-Only) (Signed)
 Reason for Consult: gallstones Referring Physician: Ralph Netty  Alejandro Willis is an 42 y.o. male.  HPI: 42 yo male with 5 days of epigastric pain. Pain came after eating hot dogs. He has had similar symptoms over the past 2 years but not as severe. It lasted several hours so he went to the ER. He has nausea and vomiting. He denies diarrhea. He denied fevers. Since being admitted he was found to have hyperbilirubinemia with concern for choledocholithiasis on MRCP.  History reviewed. No pertinent past medical history.  History reviewed. No pertinent surgical history.  History reviewed. No pertinent family history.  Social History:  reports that he has never smoked. He has never used smokeless tobacco. He reports current drug use. Frequency: 7.00 times per week. Drug: Marijuana. He reports that he does not drink alcohol.  Allergies:  Allergies  Allergen Reactions  . Other Anaphylaxis    Nuts  . Shellfish Allergy Anaphylaxis    Medications: I have reviewed the patient's current medications.  Results for orders placed or performed during the hospital encounter of 11/20/18 (from the past 48 hour(s))  Potassium     Status: None   Collection Time: 11/20/18 10:15 PM  Result Value Ref Range   Potassium 3.8 3.5 - 5.1 mmol/L    Comment: Performed at [facility], [address] [place], [place] [address]  Comprehensive metabolic panel     Status: Abnormal   Collection Time: 11/21/18  5:35 AM  Result Value Ref Range   Sodium 138 135 - 145 mmol/L   Potassium 3.9 3.5 - 5.1 mmol/L   Chloride 105 98 - 111 mmol/L   CO2 25 22 - 32 mmol/L   Glucose, Bld 97 70 - 99 mg/dL   BUN <5 (L) 6 - 20 mg/dL   Creatinine, Ser 0.76 0.61 - 1.24 mg/dL   Calcium 8.7 (L) 8.9 - 10.3 mg/dL   Total Protein 6.8 6.5 - 8.1 g/dL   Albumin 3.5 3.5 - 5.0 g/dL   AST 206 (H) 15 - 41 U/L   ALT 534 (H) 0 - 44 U/L   Alkaline Phosphatase 110 38 - 126 U/L   Total Bilirubin 4.3 (H) 0.3 - 1.2  mg/dL   GFR calc non Af Amer >60 >60 mL/min   GFR calc Af Amer >60 >60 mL/min   Anion gap 8 5 - 15    Comment: Performed at [facility], [address] [place], [place] [address]  Comprehensive metabolic panel     Status: Abnormal   Collection Time: 11/22/18  5:29 AM  Result Value Ref Range   Sodium 138 135 - 145 mmol/L   Potassium 3.6 3.5 - 5.1 mmol/L   Chloride 103 98 - 111 mmol/L   CO2 27 22 - 32 mmol/L   Glucose, Bld 94 70 - 99 mg/dL   BUN <5 (L) 6 - 20 mg/dL   Creatinine, Ser 0.89 0.61 - 1.24 mg/dL   Calcium 8.8 (L) 8.9 - 10.3 mg/dL   Total Protein 7.0 6.5 - 8.1 g/dL   Albumin 3.7 3.5 - 5.0 g/dL   AST 149 (H) 15 - 41 U/L   ALT 440 (H) 0 - 44 U/L   Alkaline Phosphatase 121 38 - 126 U/L   Total Bilirubin 4.4 (H) 0.3 - 1.2 mg/dL   GFR calc non Af Amer >60 >60 mL/min   GFR calc Af Amer >60 >60 mL/min   Anion gap 8 5 - 15      Comment: Performed at [facility], [address] [place], [place] [address]  CBC     Status: Abnormal   Collection Time: 11/22/18  5:29 AM  Result Value Ref Range   WBC 4.6 4.0 - 10.5 K/uL   RBC 5.49 4.22 - 5.81 MIL/uL   Hemoglobin 13.6 13.0 - 17.0 g/dL   HCT 96.2 95.2 - 84.1 %   MCV 80.5 80.0 - 100.0 fL   MCH 24.8 (L) 26.0 - 34.0 pg   MCHC 30.8 30.0 - 36.0 g/dL   RDW 32.4 40.1 - 02.7 %   Platelets 193 150 - 400 K/uL   nRBC 0.0 0.0 - 0.2 %    Comment: Performed at [facility], [address], [place], [place] [address]    No results found.  Review of Systems  Constitutional: Negative for chills and fever.  HENT: Negative for hearing loss.   Eyes: Negative for blurred vision and double vision.  Respiratory: Negative for cough and hemoptysis.   Cardiovascular: Negative for chest pain and palpitations.  Gastrointestinal: Positive for abdominal pain, nausea and vomiting.  Genitourinary: Negative for dysuria and urgency.  Musculoskeletal: Negative for myalgias and neck pain.  Skin:  Negative for itching and rash.  Neurological: Negative for dizziness, tingling and headaches.  Endo/Heme/Allergies: Does not bruise/bleed easily.  Psychiatric/Behavioral: Negative for depression and suicidal ideas.   Blood pressure 129/83, pulse 72, temperature 98.7 F (37.1 C), temperature source Oral, resp. rate (!) 22, height 5\' 6"  (1.676 m), weight 90.7 kg, SpO2 97 %. Physical Exam  Vitals reviewed. Constitutional: He is oriented to person, place, and time. He appears well-developed and well-nourished.  HENT:  Head: Normocephalic and atraumatic.  Eyes: Pupils are equal, round, and reactive to light. Conjunctivae and EOM are normal.  Neck: Normal range of motion. Neck supple.  Cardiovascular: Normal rate and regular rhythm.  Respiratory: Effort normal.  GI: Soft. Bowel sounds are normal. He exhibits no distension. There is abdominal tenderness in the epigastric area.  Musculoskeletal: Normal range of motion.  Neurological: He is alert and oriented to person, place, and time.  Skin: Skin is warm and dry.  Psychiatric: He has a normal mood and affect. His behavior is normal.    Assessment/Plan: 43 yo male with concern for choledocholithiasis planning for ERCP tomorrow. I think he should have his gallbladder removed during this procedure and may be able to have it tomorrow after ERCP. -We discussed the etiology of her pain, we discussed treatment options and recommended surgery. We discussed details of surgery including general anesthesia, laparoscopic approach, identification of cystic duct and common bile duct. Ligation of cystic duct and cystic artery. Possible need for intraoperative cholangiogram or open procedure. Possible risks of common bile duct injury, liver injury, cystic duct leak, bleeding, infection, post-cholecystectomy syndrome. The patient showed good understanding and all questions were answered -NPO after midnight -perioperative antibiotics  De Blanch Megha Agnes  11/22/2018, 8:57 PM

## 2018-11-22 NOTE — Progress Notes (Addendum)
PROGRESS NOTE    Alejandro Willis  JJH:[id number] DOB: Jul 10, 1976 DOA: 11/20/2018 PCP: Patient, No Pcp Per   Brief Narrative: Alejandro Willis is a 43 y.o. male with no past medical history. He presented secondary to abdominal pain and found to have cholecystitis and choledocholithiasis. Plan for ERCP and cholecystecomy.   Assessment & Plan:   Active Problems:   Elevated LFTs   Choledocholithiasis   Transaminitis   Acute choledocholithiasis GI consulted and planning ERCP on 4/6. No evidence of acute cholecystitis on imaging. -GI recommendations -Consult general surgery for anticipated cholecystectomy  Elevated AST/ALT Trending down. In setting of acute choledocholithiasis.  Marijuana use Patient counseled by previous provider  GERD Hiatal hernia -Continue Pepcid   DVT prophylaxis: SCDs Code Status:   Code Status: Full Code Family Communication: None at bedside Disposition Plan: Discharge pending GI and surgery recommendations/management   Consultants:   [facility]  Procedures:   None  Antimicrobials:  None    Subjective: No nausea, vomiting or abdominal pain. Had a normal bowel movement today.  Objective: Vitals:   11/21/18 0641 11/21/18 1449 11/21/18 2042 11/22/18 0500  BP: 109/65 124/79 (!) 131/94 132/87  Pulse: 76 76 79 64  Resp: 16 20 18 16   Temp: 98.2 F (36.8 C) 98.3 F (36.8 C) 98.3 F (36.8 C) (!) 97.5 F (36.4 C)  TempSrc: Oral Oral Oral Oral  SpO2: 97% 97% 99% 97%  Weight:      Height:        Intake/Output Summary (Last 24 hours) at 11/22/2018 0908 Last data filed at 11/22/2018 0420 Gross per 24 hour  Intake 4447.65 ml  Output --  Net 4447.65 ml   Filed Weights   11/20/18 0825  Weight: 90.7 kg    Examination:  General exam: Appears calm and comfortable Respiratory system: Clear to auscultation. Respiratory effort normal. Cardiovascular system: S1 & S2 heard, RRR. No murmurs, rubs, gallops or  clicks. Gastrointestinal system: Abdomen is nondistended, soft and nontender. No organomegaly or masses felt. Normal bowel sounds heard. Central nervous system: Alert and oriented. No focal neurological deficits. Extremities: No edema. No calf tenderness Skin: No cyanosis. No rashes Psychiatry: Judgement and insight appear normal. Mood & affect appropriate.     Data Reviewed: I have personally reviewed following labs and imaging studies  CBC: Recent Labs  Lab 11/20/18 0910 11/22/18 0529  WBC 5.7 4.6  HGB 15.4 13.6  HCT 49.8 44.2  MCV 78.9* 80.5  PLT 205 193   Basic Metabolic Panel: Recent Labs  Lab 11/20/18 0910 11/20/18 2215 11/21/18 0535 11/22/18 0529  NA 136  --  138 138  K 3.2* 3.8 3.9 3.6  CL 102  --  105 103  CO2 26  --  25 27  GLUCOSE 124*  --  97 94  BUN 5*  --  <5* <5*  CREATININE 0.81  --  0.76 0.89  CALCIUM 9.0  --  8.7* 8.8*   GFR: Estimated Creatinine Clearance: 115.3 mL/min (by C-G formula based on SCr of 0.89 mg/dL). Liver Function Tests: Recent Labs  Lab 11/20/18 0910 11/20/18 1737 11/21/18 0535 11/22/18 0529  AST 430* 337* 206* 149*  ALT 770* 691* 534* 440*  ALKPHOS 112 118 110 121  BILITOT 4.6* 5.0* 4.3* 4.4*  PROT 7.7 7.7 6.8 7.0  ALBUMIN 4.1 4.1 3.5 3.7   Recent Labs  Lab 11/20/18 0910  LIPASE 25   No results for input(s): AMMONIA in the last 168 hours. Coagulation Profile: No results for input(s):  INR, PROTIME in the last 168 hours. Cardiac Enzymes: No results for input(s): CKTOTAL, CKMB, CKMBINDEX, TROPONINI in the last 168 hours. BNP (last 3 results) No results for input(s): PROBNP in the last 8760 hours. HbA1C: No results for input(s): HGBA1C in the last 72 hours. CBG: No results for input(s): GLUCAP in the last 168 hours. Lipid Profile: No results for input(s): CHOL, HDL, LDLCALC, TRIG, CHOLHDL, LDLDIRECT in the last 72 hours. Thyroid Function Tests: No results for input(s): TSH, T4TOTAL, FREET4, T3FREE, THYROIDAB in  the last 72 hours. Anemia Panel: No results for input(s): VITAMINB12, FOLATE, FERRITIN, TIBC, IRON, RETICCTPCT in the last 72 hours. Sepsis Labs: No results for input(s): PROCALCITON, LATICACIDVEN in the last 168 hours.  No results found for this or any previous visit (from the past 240 hour(s)).       Radiology Studies: Ct Abdomen Pelvis W Contrast  Result Date: 11/20/2018 CLINICAL DATA:  43 year old male with a history of 2 days upper abdominal pain and vomiting EXAM: CT ABDOMEN AND PELVIS WITH CONTRAST TECHNIQUE: Multidetector CT imaging of the abdomen and pelvis was performed using the standard protocol following bolus administration of intravenous contrast. CONTRAST:  OMNIPAQUE IOHEXOL 300 MG/ML SOLN, 15mL ISOVUE-300 IOPAMIDOL (ISOVUE-300) INJECTION 61% COMPARISON:  07/22/2015 FINDINGS: Lower chest: No acute finding Hepatobiliary: Unremarkable appearance of liver parenchyma. Calcified cholelithiasis at the dependent aspect of the gallbladder with no pericholecystic fluid or inflammatory changes. The course of the common bile duct unremarkable with no intrahepatic or extrahepatic biliary ductal dilatation. Pancreas: Unremarkable Spleen: Unremarkable Adrenals/Urinary Tract: Unremarkable appearance of the adrenal glands. No evidence of hydronephrosis of the right or left kidney. No nephrolithiasis. Unremarkable course of the bilateral ureters. Unremarkable appearance of the urinary bladder. Stomach/Bowel: Unremarkable stomach. Unremarkable small bowel. No abnormal distention. No transition point. Normal appendix. Diverticular disease without evidence of acute inflammatory changes. No significant stool burden. Vascular/Lymphatic: No atherosclerotic changes. No aneurysm. Proximal femoral arteries patent. No adenopathy. Reproductive: Unremarkable pelvic organs. Other: None Musculoskeletal: No acute displaced fracture. Scoliotic curvature again noted. No significant degenerative changes.  IMPRESSION: No CT evidence of acute abnormality. Cholelithiasis without CT evidence of acute cholecystitis. Diverticular disease. Electronically Signed   By: Gilmer Mor D.O.   On: 11/20/2018 10:24   Mr 3d Recon At Scanner  Result Date: 11/21/2018 CLINICAL DATA:  Abdominal pain for several months.  Cholelithiasis. EXAM: MRI ABDOMEN WITHOUT AND WITH CONTRAST (INCLUDING MRCP) TECHNIQUE: Multiplanar multisequence MR imaging of the abdomen was performed both before and after the administration of intravenous contrast. Heavily T2-weighted images of the biliary and pancreatic ducts were obtained, and three-dimensional MRCP images were rendered by post processing. CONTRAST:  10 mL Gadavist COMPARISON:  CT on 11/20/2018 FINDINGS: Lower chest: No acute findings. Hepatobiliary: No hepatic masses identified. Numerous tiny gallstones are seen, however there is no evidence of gallbladder wall thickening or pericholecystic inflammatory changes. Several small calculi are seen within the cystic duct. There is no evidence of biliary ductal dilatation which limits evaluation of the common bile duct, however there are several subtle filling defects in the distal common bile duct suspicious for choledocholithiasis. Pancreas: No mass or inflammatory changes. No evidence of pancreatic ductal dilatation. Spleen:  Within normal limits in size and appearance. Adrenals/Urinary Tract: No masses identified. No evidence of hydronephrosis. Stomach/Bowel: Visualized portion unremarkable. Vascular/Lymphatic: No pathologically enlarged lymph nodes identified. No abdominal aortic aneurysm. Other:  None. Musculoskeletal:  No suspicious bone lesions identified. IMPRESSION: Numerous tiny gallstones and several calculi seen in the cystic duct.  No radiographic evidence of cholecystitis. No evidence of biliary ductal dilatation, however several tiny calculi are suspected in the distal common bile duct. Electronically Signed   By: Myles Rosenthal M.D.    On: 11/21/2018 08:31   Mr Abdomen Mrcp Vivien Rossetti Contast  Result Date: 11/21/2018 CLINICAL DATA:  Abdominal pain for several months.  Cholelithiasis. EXAM: MRI ABDOMEN WITHOUT AND WITH CONTRAST (INCLUDING MRCP) TECHNIQUE: Multiplanar multisequence MR imaging of the abdomen was performed both before and after the administration of intravenous contrast. Heavily T2-weighted images of the biliary and pancreatic ducts were obtained, and three-dimensional MRCP images were rendered by post processing. CONTRAST:  10 mL Gadavist COMPARISON:  CT on 11/20/2018 FINDINGS: Lower chest: No acute findings. Hepatobiliary: No hepatic masses identified. Numerous tiny gallstones are seen, however there is no evidence of gallbladder wall thickening or pericholecystic inflammatory changes. Several small calculi are seen within the cystic duct. There is no evidence of biliary ductal dilatation which limits evaluation of the common bile duct, however there are several subtle filling defects in the distal common bile duct suspicious for choledocholithiasis. Pancreas: No mass or inflammatory changes. No evidence of pancreatic ductal dilatation. Spleen:  Within normal limits in size and appearance. Adrenals/Urinary Tract: No masses identified. No evidence of hydronephrosis. Stomach/Bowel: Visualized portion unremarkable. Vascular/Lymphatic: No pathologically enlarged lymph nodes identified. No abdominal aortic aneurysm. Other:  None. Musculoskeletal:  No suspicious bone lesions identified. IMPRESSION: Numerous tiny gallstones and several calculi seen in the cystic duct. No radiographic evidence of cholecystitis. No evidence of biliary ductal dilatation, however several tiny calculi are suspected in the distal common bile duct. Electronically Signed   By: Myles Rosenthal M.D.   On: 11/21/2018 08:31   US Abdomen Limited Ruq  Result Date: 11/20/2018 CLINICAL DATA:  Cholelithiasis and upper abdominal pain EXAM: ULTRASOUND ABDOMEN LIMITED RIGHT UPPER  QUADRANT COMPARISON:  CT from earlier in the same day. FINDINGS: Gallbladder: Gallbladder is well distended and demonstrates multiple gallstones. No pericholecystic fluid or wall thickening is noted. Negative sonographic Eulah Pont sign is elicited. Common bile duct: Diameter: 3.3 mm. Liver: No focal lesion identified. Within normal limits in parenchymal echogenicity. Portal vein is patent on color Doppler imaging with normal direction of blood flow towards the liver. IMPRESSION: Cholelithiasis without complicating factors. Electronically Signed   By: Alcide Clever M.D.   On: 11/20/2018 11:35        Scheduled Meds: Continuous Infusions:  ampicillin-sulbactam (UNASYN) IV 3 g (11/22/18 0643)   famotidine (PEPCID) IV 20 mg (11/22/18 0905)   lactated ringers 125 mL/hr at 11/22/18 0641     LOS: 1 day     Jacquelin Hawking, MD [facility] 11/22/2018, 9:08 AM  If 7PM-7AM, please contact night-coverage [email or web address]

## 2018-11-23 ENCOUNTER — Inpatient Hospital Stay (HOSPITAL_COMMUNITY): Payer: BLUE CROSS/BLUE SHIELD | Admitting: Certified Registered Nurse Anesthetist

## 2018-11-23 ENCOUNTER — Inpatient Hospital Stay (HOSPITAL_COMMUNITY): Payer: BLUE CROSS/BLUE SHIELD

## 2018-11-23 ENCOUNTER — Other Ambulatory Visit: Payer: Self-pay

## 2018-11-23 ENCOUNTER — Encounter (HOSPITAL_COMMUNITY)
Admission: EM | Disposition: A | Discharge status: Home / Self Care | Payer: Self-pay | Source: Home / Self Care | Attending: Internal Medicine

## 2018-11-23 ENCOUNTER — Encounter (HOSPITAL_COMMUNITY): Payer: Self-pay | Admitting: Emergency Medicine

## 2018-11-23 DIAGNOSIS — K812 Acute cholecystitis with chronic cholecystitis: Secondary | ICD-10-CM

## 2018-11-23 DIAGNOSIS — J45909 Unspecified asthma, uncomplicated: Secondary | ICD-10-CM | POA: Insufficient documentation

## 2018-11-23 DIAGNOSIS — K8050 Calculus of bile duct without cholangitis or cholecystitis without obstruction: Secondary | ICD-10-CM | POA: Diagnosis not present

## 2018-11-23 HISTORY — PX: REMOVAL OF STONES: SHX5545

## 2018-11-23 HISTORY — PX: PANCREATIC STENT PLACEMENT: SHX5539

## 2018-11-23 HISTORY — PX: ERCP: SHX5425

## 2018-11-23 HISTORY — PX: SPHINCTEROTOMY: SHX5544

## 2018-11-23 HISTORY — PX: CHOLECYSTECTOMY: SHX55

## 2018-11-23 LAB — COMPREHENSIVE METABOLIC PANEL
ALT: 393 U/L — ABNORMAL HIGH (ref 0–44)
AST: 162 U/L — ABNORMAL HIGH (ref 15–41)
Albumin: 3.7 g/dL (ref 3.5–5.0)
Alkaline Phosphatase: 118 U/L (ref 38–126)
Anion gap: 9 (ref 5–15)
BUN: 6 mg/dL (ref 6–20)
CO2: 25 mmol/L (ref 22–32)
Calcium: 8.9 mg/dL (ref 8.9–10.3)
Chloride: 103 mmol/L (ref 98–111)
Creatinine, Ser: 0.94 mg/dL (ref 0.61–1.24)
GFR calc Af Amer: >60 mL/min (ref >60)
GFR calc non Af Amer: >60 mL/min (ref >60)
Glucose, Bld: 86 mg/dL (ref 70–99)
Potassium: 3.6 mmol/L (ref 3.5–5.1)
Sodium: 137 mmol/L (ref 135–145)
Total Bilirubin: 4.9 mg/dL — ABNORMAL HIGH (ref 0.3–1.2)
Total Protein: 7.2 g/dL (ref 6.5–8.1)

## 2018-11-23 LAB — CBC WITH DIFFERENTIAL/PLATELET
Abs Immature Granulocytes: 0.03 10*3/uL (ref 0.00–0.07)
Basophils Absolute: 0 10*3/uL (ref 0.0–0.1)
Basophils Relative: 0 %
Eosinophils Absolute: 0 10*3/uL (ref 0.0–0.5)
Eosinophils Relative: 0 %
HCT: 43.5 % (ref 39.0–52.0)
Hemoglobin: 13.9 g/dL (ref 13.0–17.0)
Immature Granulocytes: 0 %
Lymphocytes Relative: 10 %
Lymphs Abs: 0.8 10*3/uL (ref 0.7–4.0)
MCH: 25.2 pg — ABNORMAL LOW (ref 26.0–34.0)
MCHC: 32 g/dL (ref 30.0–36.0)
MCV: 78.8 fL — ABNORMAL LOW (ref 80.0–100.0)
Monocytes Absolute: 0.4 10*3/uL (ref 0.1–1.0)
Monocytes Relative: 5 %
Neutro Abs: 6.2 10*3/uL (ref 1.7–7.7)
Neutrophils Relative %: 85 %
Platelets: 180 10*3/uL (ref 150–400)
RBC: 5.52 MIL/uL (ref 4.22–5.81)
RDW: 15.2 % (ref 11.5–15.5)
WBC: 7.4 10*3/uL (ref 4.0–10.5)
nRBC: 0 % (ref 0.0–0.2)

## 2018-11-23 LAB — SURGICAL PCR SCREEN
MRSA, PCR: NEGATIVE
Staphylococcus aureus: NEGATIVE

## 2018-11-23 SURGERY — LAPAROSCOPIC CHOLECYSTECTOMY WITH INTRAOPERATIVE CHOLANGIOGRAM
Anesthesia: General

## 2018-11-23 SURGERY — CANCELLED PROCEDURE

## 2018-11-23 MED ORDER — MENTHOL 3 MG MT LOZG: 1.0000 | LOZENGE | OROMUCOSAL | Status: DC | PRN

## 2018-11-23 MED ORDER — BUPIVACAINE-EPINEPHRINE (PF) 0.25% -1:200000 IJ SOLN
INTRAMUSCULAR | Status: AC
  Filled 2018-11-23: qty 30

## 2018-11-23 MED ORDER — SUCCINYLCHOLINE CHLORIDE 200 MG/10ML IV SOSY
PREFILLED_SYRINGE | INTRAVENOUS | Status: AC
  Filled 2018-11-23: qty 10

## 2018-11-23 MED ORDER — LACTATED RINGERS IR SOLN
Status: DC | PRN
  Administered 2018-11-23: 3000 mL

## 2018-11-23 MED ORDER — LIDOCAINE 2% (20 MG/ML) 5 ML SYRINGE
INTRAMUSCULAR | Status: DC | PRN
  Administered 2018-11-23: 60 mg via INTRAVENOUS

## 2018-11-23 MED ORDER — SUGAMMADEX SODIUM 200 MG/2ML IV SOLN
INTRAVENOUS | Status: AC
  Filled 2018-11-23: qty 2

## 2018-11-23 MED ORDER — SODIUM CHLORIDE 0.9 % IV SOLN
INTRAVENOUS | Status: DC | PRN
  Administered 2018-11-23: 11:00:00 30 mL

## 2018-11-23 MED ORDER — LACTATED RINGERS IV BOLUS: 1000.0000 mL | Freq: Three times a day (TID) | INTRAVENOUS | Status: AC | PRN

## 2018-11-23 MED ORDER — MORPHINE SULFATE (PF) 2 MG/ML IV SOLN: 1.0000 mg | INTRAVENOUS | Status: DC | PRN

## 2018-11-23 MED ORDER — GLUCAGON HCL RDNA (DIAGNOSTIC) 1 MG IJ SOLR
INTRAMUSCULAR | Status: AC
  Filled 2018-11-23: qty 1

## 2018-11-23 MED ORDER — BUPIVACAINE LIPOSOME 1.3 % IJ SUSP
20.0000 mL | Freq: Once | INTRAMUSCULAR | Status: AC
  Administered 2018-11-23: 20 mL
  Filled 2018-11-23: qty 20

## 2018-11-23 MED ORDER — HYDROCORTISONE 2.5 % RE CREA: 1.0000 "application " | TOPICAL_CREAM | Freq: Four times a day (QID) | RECTAL | Status: DC | PRN

## 2018-11-23 MED ORDER — BUPIVACAINE-EPINEPHRINE 0.25% -1:200000 IJ SOLN
INTRAMUSCULAR | Status: DC | PRN
  Administered 2018-11-23: 30 mL

## 2018-11-23 MED ORDER — HYDROCORTISONE 1 % EX CREA: 1.0000 "application " | TOPICAL_CREAM | Freq: Three times a day (TID) | CUTANEOUS | Status: DC | PRN

## 2018-11-23 MED ORDER — ONDANSETRON HCL 4 MG/2ML IJ SOLN: 4.0000 mg | Freq: Once | INTRAMUSCULAR | Status: DC | PRN

## 2018-11-23 MED ORDER — IBUPROFEN 200 MG PO TABS
600.0000 mg | ORAL_TABLET | Freq: Three times a day (TID) | ORAL | Status: DC
  Administered 2018-11-24 – 2018-11-28 (×12): 600 mg via ORAL
  Filled 2018-11-23 (×12): qty 3

## 2018-11-23 MED ORDER — BISACODYL 10 MG RE SUPP: 10.0000 mg | Freq: Two times a day (BID) | RECTAL | Status: DC | PRN

## 2018-11-23 MED ORDER — PROPOFOL 10 MG/ML IV BOLUS
INTRAVENOUS | Status: DC | PRN
  Administered 2018-11-23: 200 mg via INTRAVENOUS

## 2018-11-23 MED ORDER — INDOMETHACIN 50 MG RE SUPP
RECTAL | Status: DC | PRN
  Administered 2018-11-23: 100 mg via RECTAL

## 2018-11-23 MED ORDER — FENTANYL CITRATE (PF) 250 MCG/5ML IJ SOLN
INTRAMUSCULAR | Status: AC
  Filled 2018-11-23: qty 5

## 2018-11-23 MED ORDER — FENTANYL CITRATE (PF) 100 MCG/2ML IJ SOLN: 25.0000 ug | INTRAMUSCULAR | Status: DC | PRN

## 2018-11-23 MED ORDER — LACTATED RINGERS IV SOLN
INTRAVENOUS | Status: DC
  Administered 2018-11-23: 07:00:00 via INTRAVENOUS

## 2018-11-23 MED ORDER — MIDAZOLAM HCL 5 MG/5ML IJ SOLN
INTRAMUSCULAR | Status: DC | PRN
  Administered 2018-11-23: 2 mg via INTRAVENOUS

## 2018-11-23 MED ORDER — ALUM & MAG HYDROXIDE-SIMETH 200-200-20 MG/5ML PO SUSP
30.0000 mL | Freq: Four times a day (QID) | ORAL | Status: DC | PRN
  Filled 2018-11-23: qty 30

## 2018-11-23 MED ORDER — ACETAMINOPHEN 500 MG PO TABS: 1000.0000 mg | ORAL_TABLET | Freq: Three times a day (TID) | ORAL | Status: DC

## 2018-11-23 MED ORDER — MIDAZOLAM HCL 2 MG/2ML IJ SOLN
INTRAMUSCULAR | Status: AC
  Filled 2018-11-23: qty 2

## 2018-11-23 MED ORDER — PROMETHAZINE HCL 25 MG/ML IJ SOLN
INTRAMUSCULAR | Status: AC
  Filled 2018-11-23: qty 1

## 2018-11-23 MED ORDER — SUGAMMADEX SODIUM 200 MG/2ML IV SOLN
INTRAVENOUS | Status: DC | PRN
  Administered 2018-11-23: 200 mg via INTRAVENOUS

## 2018-11-23 MED ORDER — PSYLLIUM 95 % PO PACK
1.0000 | PACK | Freq: Every day | ORAL | Status: DC
  Administered 2018-11-24 – 2018-11-28 (×4): 1 via ORAL
  Filled 2018-11-23 (×5): qty 1

## 2018-11-23 MED ORDER — ONDANSETRON HCL 4 MG/2ML IJ SOLN
INTRAMUSCULAR | Status: AC
  Filled 2018-11-23: qty 2

## 2018-11-23 MED ORDER — SODIUM CHLORIDE (PF) 0.9 % IJ SOLN
PREFILLED_SYRINGE | INTRAMUSCULAR | Status: DC | PRN
  Administered 2018-11-23: 10:00:00 8 mL

## 2018-11-23 MED ORDER — POLYETHYLENE GLYCOL 3350 17 G PO PACK: 17.0000 g | PACK | Freq: Two times a day (BID) | ORAL | Status: DC | PRN

## 2018-11-23 MED ORDER — IOHEXOL 300 MG/ML  SOLN
INTRAMUSCULAR | Status: DC | PRN
  Administered 2018-11-23: 8 mL

## 2018-11-23 MED ORDER — SODIUM CHLORIDE 0.9 % IV SOLN
8.0000 mg | Freq: Four times a day (QID) | INTRAVENOUS | Status: DC | PRN
  Filled 2018-11-23: qty 4

## 2018-11-23 MED ORDER — ROCURONIUM BROMIDE 50 MG/5ML IV SOSY
PREFILLED_SYRINGE | INTRAVENOUS | Status: DC | PRN
  Administered 2018-11-23: 50 mg via INTRAVENOUS
  Administered 2018-11-23: 20 mg via INTRAVENOUS
  Administered 2018-11-23 (×2): 10 mg via INTRAVENOUS
  Administered 2018-11-23: 20 mg via INTRAVENOUS

## 2018-11-23 MED ORDER — HYDROMORPHONE HCL 1 MG/ML IJ SOLN: 0.5000 mg | INTRAMUSCULAR | Status: DC | PRN

## 2018-11-23 MED ORDER — ONDANSETRON HCL 4 MG/2ML IJ SOLN
4.0000 mg | Freq: Four times a day (QID) | INTRAMUSCULAR | Status: DC | PRN
  Administered 2018-11-23: 4 mg via INTRAVENOUS
  Filled 2018-11-23: qty 2

## 2018-11-23 MED ORDER — EPHEDRINE 5 MG/ML INJ
INTRAVENOUS | Status: AC
  Filled 2018-11-23: qty 10

## 2018-11-23 MED ORDER — PROPOFOL 10 MG/ML IV BOLUS
INTRAVENOUS | Status: AC
  Filled 2018-11-23: qty 20

## 2018-11-23 MED ORDER — GUAIFENESIN-DM 100-10 MG/5ML PO SYRP: 10.0000 mL | ORAL_SOLUTION | ORAL | Status: DC | PRN

## 2018-11-23 MED ORDER — TRAMADOL HCL 50 MG PO TABS: 50.0000 mg | ORAL_TABLET | Freq: Four times a day (QID) | ORAL | Status: DC | PRN

## 2018-11-23 MED ORDER — PROCHLORPERAZINE EDISYLATE 10 MG/2ML IJ SOLN
5.0000 mg | INTRAMUSCULAR | Status: DC | PRN
  Administered 2018-11-24 – 2018-11-27 (×3): 10 mg via INTRAVENOUS
  Filled 2018-11-23 (×4): qty 2

## 2018-11-23 MED ORDER — SODIUM CHLORIDE 0.9 % IV SOLN: INTRAVENOUS | Status: DC

## 2018-11-23 MED ORDER — DEXAMETHASONE SODIUM PHOSPHATE 10 MG/ML IJ SOLN
INTRAMUSCULAR | Status: DC | PRN
  Administered 2018-11-23: 10 mg via INTRAVENOUS

## 2018-11-23 MED ORDER — GABAPENTIN 300 MG PO CAPS
300.0000 mg | ORAL_CAPSULE | Freq: Two times a day (BID) | ORAL | Status: DC
  Administered 2018-11-24 – 2018-11-28 (×8): 300 mg via ORAL
  Filled 2018-11-23 (×8): qty 1

## 2018-11-23 MED ORDER — HYDRALAZINE HCL 20 MG/ML IJ SOLN: 5.0000 mg | INTRAMUSCULAR | Status: DC | PRN

## 2018-11-23 MED ORDER — LIP MEDEX EX OINT
1.0000 "application " | TOPICAL_OINTMENT | Freq: Two times a day (BID) | CUTANEOUS | Status: DC
  Administered 2018-11-26 – 2018-11-27 (×2): 1 via TOPICAL
  Filled 2018-11-23: qty 7

## 2018-11-23 MED ORDER — PROMETHAZINE HCL 25 MG/ML IJ SOLN
12.5000 mg | Freq: Once | INTRAMUSCULAR | Status: AC
  Administered 2018-11-23: 12.5 mg via INTRAVENOUS

## 2018-11-23 MED ORDER — METOPROLOL TARTRATE 5 MG/5ML IV SOLN: 5.0000 mg | Freq: Four times a day (QID) | INTRAVENOUS | Status: DC | PRN

## 2018-11-23 MED ORDER — OXYCODONE HCL 5 MG/5ML PO SOLN: 5.0000 mg | Freq: Once | ORAL | Status: DC | PRN

## 2018-11-23 MED ORDER — INDOMETHACIN 50 MG RE SUPP
RECTAL | Status: AC
  Filled 2018-11-23: qty 2

## 2018-11-23 MED ORDER — EPHEDRINE SULFATE-NACL 50-0.9 MG/10ML-% IV SOSY
PREFILLED_SYRINGE | INTRAVENOUS | Status: DC | PRN
  Administered 2018-11-23 (×2): 5 mg via INTRAVENOUS

## 2018-11-23 MED ORDER — ACETAMINOPHEN 500 MG PO TABS
1000.0000 mg | ORAL_TABLET | Freq: Four times a day (QID) | ORAL | Status: DC
  Administered 2018-11-23 – 2018-11-28 (×17): 1000 mg via ORAL
  Filled 2018-11-23 (×19): qty 2

## 2018-11-23 MED ORDER — MAGIC MOUTHWASH
15.0000 mL | Freq: Four times a day (QID) | ORAL | Status: DC | PRN
  Filled 2018-11-23: qty 15

## 2018-11-23 MED ORDER — LORATADINE 10 MG PO TABS
10.0000 mg | ORAL_TABLET | Freq: Every day | ORAL | Status: DC
  Administered 2018-11-24 – 2018-11-28 (×4): 10 mg via ORAL
  Filled 2018-11-23 (×4): qty 1

## 2018-11-23 MED ORDER — SUCCINYLCHOLINE CHLORIDE 200 MG/10ML IV SOSY
PREFILLED_SYRINGE | INTRAVENOUS | Status: DC | PRN
  Administered 2018-11-23: 100 mg via INTRAVENOUS

## 2018-11-23 MED ORDER — FENTANYL CITRATE (PF) 100 MCG/2ML IJ SOLN
INTRAMUSCULAR | Status: DC | PRN
  Administered 2018-11-23 (×4): 50 ug via INTRAVENOUS

## 2018-11-23 MED ORDER — LIDOCAINE 2% (20 MG/ML) 5 ML SYRINGE
INTRAMUSCULAR | Status: AC
  Filled 2018-11-23: qty 5

## 2018-11-23 MED ORDER — DEXAMETHASONE SODIUM PHOSPHATE 10 MG/ML IJ SOLN
INTRAMUSCULAR | Status: AC
  Filled 2018-11-23: qty 1

## 2018-11-23 MED ORDER — OXYCODONE HCL 5 MG PO TABS: 5.0000 mg | ORAL_TABLET | Freq: Once | ORAL | Status: DC | PRN

## 2018-11-23 MED ORDER — SODIUM CHLORIDE 0.9 % IR SOLN
Status: DC | PRN
  Administered 2018-11-23: 1000 mL

## 2018-11-23 MED ORDER — PANTOPRAZOLE SODIUM 40 MG IV SOLR
40.0000 mg | INTRAVENOUS | Status: DC
  Administered 2018-11-23 – 2018-11-27 (×5): 40 mg via INTRAVENOUS
  Filled 2018-11-23 (×5): qty 40

## 2018-11-23 MED ORDER — ONDANSETRON HCL 4 MG/2ML IJ SOLN
INTRAMUSCULAR | Status: DC | PRN
  Administered 2018-11-23: 4 mg via INTRAVENOUS

## 2018-11-23 MED ORDER — ROCURONIUM BROMIDE 100 MG/10ML IV SOLN
INTRAVENOUS | Status: AC
  Filled 2018-11-23: qty 1

## 2018-11-23 MED ORDER — PHENOL 1.4 % MT LIQD: 1.0000 | OROMUCOSAL | Status: DC | PRN

## 2018-11-23 SURGICAL SUPPLY — 40 items
APPLIER CLIP 5 13 M/L LIGAMAX5 (MISCELLANEOUS)
APPLIER CLIP ROT 10 11.4 M/L (STAPLE)
APR CLP MED LRG 11.4X10 (STAPLE)
APR CLP MED LRG 5 ANG JAW (MISCELLANEOUS)
BAG SPEC RTRVL 10 TROC 200 (ENDOMECHANICALS) ×2
CABLE HIGH FREQUENCY MONO STRZ (ELECTRODE) IMPLANT
CLIP APPLIE 5 13 M/L LIGAMAX5 (MISCELLANEOUS) IMPLANT
CLIP APPLIE ROT 10 11.4 M/L (STAPLE) IMPLANT
COVER MAYO STAND STRL (DRAPES) ×4 IMPLANT
COVER SURGICAL LIGHT HANDLE (MISCELLANEOUS) ×4 IMPLANT
COVER WAND RF STERILE (DRAPES) ×4 IMPLANT
DECANTER SPIKE VIAL GLASS SM (MISCELLANEOUS) ×4 IMPLANT
DRAPE C-ARM 42X120 X-RAY (DRAPES) ×4 IMPLANT
DRAPE UTILITY XL STRL (DRAPES) ×4 IMPLANT
DRAPE WARM FLUID 44X44 (DRAPE) ×4 IMPLANT
DRSG TEGADERM 2-3/8X2-3/4 SM (GAUZE/BANDAGES/DRESSINGS) ×16 IMPLANT
ELECT REM PT RETURN 15FT ADLT (MISCELLANEOUS) ×4 IMPLANT
ENDOLOOP SUT PDS II  0 18 (SUTURE) ×4
ENDOLOOP SUT PDS II 0 18 (SUTURE) IMPLANT
GLOVE ECLIPSE 8.0 STRL XLNG CF (GLOVE) ×4 IMPLANT
GLOVE INDICATOR 8.0 STRL GRN (GLOVE) ×4 IMPLANT
GOWN STRL REUS W/TWL XL LVL3 (GOWN DISPOSABLE) ×8 IMPLANT
IRRIG SUCT STRYKERFLOW 2 WTIP (MISCELLANEOUS) ×4
IRRIGATION SUCT STRKRFLW 2 WTP (MISCELLANEOUS) ×2 IMPLANT
KIT BASIN OR (CUSTOM PROCEDURE TRAY) ×4 IMPLANT
KIT TURNOVER KIT A (KITS) IMPLANT
POUCH RETRIEVAL ECOSAC 10 (ENDOMECHANICALS) ×2 IMPLANT
POUCH RETRIEVAL ECOSAC 10MM (ENDOMECHANICALS) ×2
SCISSORS LAP 5X35 DISP (ENDOMECHANICALS) ×4 IMPLANT
SET CHOLANGIOGRAPH MIX (MISCELLANEOUS) ×4 IMPLANT
SET TUBE SMOKE EVAC HIGH FLOW (TUBING) ×4 IMPLANT
SLEEVE XCEL OPT CAN 5 100 (ENDOMECHANICALS) IMPLANT
SUT MNCRL AB 4-0 PS2 18 (SUTURE) ×4 IMPLANT
SUT PDS AB 1 CT1 27 (SUTURE) ×2 IMPLANT
SYR 20CC LL (SYRINGE) ×4 IMPLANT
TOWEL OR 17X26 10 PK STRL BLUE (TOWEL DISPOSABLE) ×4 IMPLANT
TOWEL OR NON WOVEN STRL DISP B (DISPOSABLE) ×4 IMPLANT
TRAY LAPAROSCOPIC (CUSTOM PROCEDURE TRAY) ×4 IMPLANT
TROCAR BLADELESS OPT 5 100 (ENDOMECHANICALS) ×4 IMPLANT
TROCAR XCEL NON-BLD 11X100MML (ENDOMECHANICALS) ×4 IMPLANT

## 2018-11-23 NOTE — Interval H&P Note (Signed)
History and Physical Interval Note:  11/23/2018 7:44 AM  Alejandro Willis  has presented today for surgery, with the diagnosis of Cholecystitis.  The various methods of treatment have been discussed with the patient and family. After consideration of risks, benefits and other options for treatment, the patient has consented to  Procedure(s): LAPAROSCOPIC CHOLECYSTECTOMY WITH INTRAOPERATIVE CHOLANGIOGRAM (N/A) ENDOSCOPIC RETROGRADE CHOLANGIOPANCREATOGRAPHY (ERCP) (N/A) as a surgical intervention.  The patient's history has been reviewed, patient examined, no change in status, stable for surgery.  I have reviewed the patient's chart and labs.  Questions were answered to the patient's satisfaction.    The anatomy & physiology of hepatobiliary & pancreatic function was discussed.  The pathophysiology of gallbladder dysfunction was discussed.  Natural history risks without surgery was discussed.   I feel the risks of no intervention will lead to serious problems that outweigh the operative risks; therefore, I recommended cholecystectomy to remove the pathology.  I explained laparoscopic techniques with possible need for an open approach.  Probable cholangiogram to evaluate the bilary tract was explained as well.    Risks such as bleeding, infection, abscess, leak, injury to other organs, need for repair of tissues / organs, need for further treatment, stroke, heart attack, death, and other risks were discussed.  I noted a good likelihood this will help address the problem.  Possibility that this will not correct all abdominal symptoms was explained.  Goals of post-operative recovery were discussed as well.  We will work to minimize complications.  An educational handout further explaining the pathology and treatment options was given as well.  Questions were answered.  The patient expresses understanding & wishes to proceed with surgery.  I have re-reviewed the the patient's records, history, medications,  and allergies.  I have re-examined the patient.  I again discussed intraoperative plans and goals of post-operative recovery.  The patient agrees to proceed.  Alejandro Willis  02/06/1976 [id number]  Patient Care Team: Patient, No Pcp Per as PCP - General (General Practice)  Patient Active Problem List   Diagnosis Date Noted  . Transaminitis 11/21/2018  . Elevated LFTs 11/20/2018  . Choledocholithiasis 11/20/2018    History reviewed. No pertinent past medical history.  History reviewed. No pertinent surgical history.  Social History   Socioeconomic History  . Marital status: Married    Spouse name: Not on file  . Number of children: Not on file  . Years of education: Not on file  . Highest education level: Not on file  Occupational History  . Not on file  Social Needs  . Financial resource strain: Not on file  . Food insecurity:    Worry: Not on file    Inability: Not on file  . Transportation needs:    Medical: Not on file    Non-medical: Not on file  Tobacco Use  . Smoking status: Never Smoker  . Smokeless tobacco: Never Used  Substance and Sexual Activity  . Alcohol use: No  . Drug use: Yes    Frequency: 7.0 times per week    Types: Marijuana  . Sexual activity: Not on file  Lifestyle  . Physical activity:    Days per week: Not on file    Minutes per session: Not on file  . Stress: Not on file  Relationships  . Social connections:    Talks on phone: Not on file    Gets together: Not on file    Attends religious service: Not on file    Active member of  club or organization: Not on file    Attends meetings of clubs or organizations: Not on file    Relationship status: Not on file  . Intimate partner violence:    Fear of current or ex partner: Not on file    Emotionally abused: Not on file    Physically abused: Not on file    Forced sexual activity: Not on file  Other Topics Concern  . Not on file  Social History Narrative  . Not on file     History reviewed. No pertinent family history.  Medications Prior to Admission  Medication Sig Dispense Refill Last Dose  . loratadine (CLARITIN) 10 MG tablet Take 10 mg by mouth daily.   Past Week at Unknown time  . albuterol (PROVENTIL HFA;VENTOLIN HFA) 108 (90 Base) MCG/ACT inhaler Inhale 2 puffs into the lungs every 6 (six) hours as needed for wheezing or shortness of breath.    unknown    Current Facility-Administered Medications  Medication Dose Route Frequency Provider Last RatGraylin Shiver  . 0.9 %  sodium chloride infusion   Intravenous Continuous Ganem, Salem F, MD      . Mitzi Hansen Hold] Ampicillin-Sulbactam (UNASYN) 3 g in sodium chloride 0.9 % 100 mL IVPB  3 g Intravenous Q6H Alessandra Bevels, MD 200 mL/hr at 11/23/18 0622 3 g at 11/23/18 0622  . [MAR Hold] bisacodyl (DULCOLAX) EC tablet 5 mg  5 mg Oral Daily PRN Alessandra Bevels, MD      . bupivacaine liposome (EXPAREL) 1.3 % injection 266 mg  20 mL Infiltration Once Karie Soda, MD      . Mitzi Hansen Hold] famotidine (PEPCID) IVPB 20 mg premix  20 mg Intravenous Q12H Alessandra Bevels, MD 100 mL/hr at 11/22/18 2035 20 mg at 11/22/18 2035  . [MAR Hold] hydrOXYzine (ATARAX/VISTARIL) tablet 10 mg  10 mg Oral TID PRN Alessandra Bevels, MD      . lactated ringers infusion   Intravenous Continuous Alessandra Bevels, MD 125 mL/hr at 11/23/18 0622    . lactated ringers infusion   Intravenous Continuous Lucretia Kern, MD 50 mL/hr at 11/23/18 [phone]    . [MAR Hold] ondansetron (ZOFRAN) injection 4 mg  4 mg Intravenous Q6H PRN Alessandra Bevels, MD   4 mg at 11/22/18 1106  . [MAR Hold] oxyCODONE (Oxy IR/ROXICODONE) immediate release tablet 5 mg  5 mg Oral Q4H PRN Alessandra Bevels, MD   5 mg at 11/21/18 0335     Allergies  Allergen Reactions  . Other Anaphylaxis    Nuts  . Shellfish Allergy Anaphylaxis    BP 124/74 (BP Location: Right Arm)   Pulse 68   Temp 98.4 F (36.9 C) (Oral)   Resp 19   Ht  (1.676 m)   Wt 90.7 kg    SpO2 98%   BMI 32.28 kg/m   Labs: Results for orders placed or performed during the hospital encounter of 11/20/18 (from the past 48 hour(s))  Comprehensive metabolic panel     Status: Abnormal   Collection Time: 11/22/18  5:29 AM  Result Value Ref Range   Sodium 138 135 - 145 mmol/L   Potassium 3.6 3.5 - 5.1 mmol/L   Chloride 103 98 - 111 mmol/L   CO2 27 22 - 32 mmol/L   Glucose, Bld 94 70 - 99 mg/dL   BUN <5 (L) 6 - 20 mg/dL   Creatinine, Ser 1.19 0.61 - 1.24 mg/dL   Calcium 8.8 (L) 8.9 - 10.3 mg/dL   Total  Protein 7.0 6.5 - 8.1 g/dL   Albumin 3.7 3.5 - 5.0 g/dL   AST 161149 (H) 15 - 41 U/L   ALT 440 (H) 0 - 44 U/L   Alkaline Phosphatase 121 38 - 126 U/L   Total Bilirubin 4.4 (H) 0.3 - 1.2 mg/dL   GFR calc non Af Amer >60 >60 mL/min   GFR calc Af Amer >60 >60 mL/min   Anion gap 8 5 - 15    Comment: Performed at [facility], [address] [place], [place] [address]  CBC     Status: Abnormal   Collection Time: 11/22/18  5:29 AM  Result Value Ref Range   WBC 4.6 4.0 - 10.5 K/uL   RBC 5.49 4.22 - 5.81 MIL/uL   Hemoglobin 13.6 13.0 - 17.0 g/dL   HCT 54.044.2 98.139.0 - 19.152.0 %   MCV 80.5 80.0 - 100.0 fL   MCH 24.8 (L) 26.0 - 34.0 pg   MCHC 30.8 30.0 - 36.0 g/dL   RDW 47.815.1 29.511.5 - 62.115.5 %   Platelets 193 150 - 400 K/uL   nRBC 0.0 0.0 - 0.2 %    Comment: Performed at [facility], [address] [place], [place] [address]  Comprehensive metabolic panel     Status: Abnormal   Collection Time: 11/23/18  4:37 AM  Result Value Ref Range   Sodium 137 135 - 145 mmol/L   Potassium 3.6 3.5 - 5.1 mmol/L   Chloride 103 98 - 111 mmol/L   CO2 25 22 - 32 mmol/L   Glucose, Bld 86 70 - 99 mg/dL   BUN 6 6 - 20 mg/dL   Creatinine, Ser 7.840.94 0.61 - 1.24 mg/dL   Calcium 8.9 8.9 - 69.610.3 mg/dL   Total Protein 7.2 6.5 - 8.1 g/dL   Albumin 3.7 3.5 - 5.0 g/dL   AST 295162 (H) 15 - 41 U/L   ALT 393 (H) 0 - 44 U/L   Alkaline Phosphatase 118 38 - 126 U/L   Total  Bilirubin 4.9 (H) 0.3 - 1.2 mg/dL   GFR calc non Af Amer >60 >60 mL/min   GFR calc Af Amer >60 >60 mL/min   Anion gap 9 5 - 15    Comment: Performed at [facility], [address] [place], [place] [address]    Imaging / Studies: Ct Abdomen Pelvis W Contrast  Result Date: 11/20/2018 CLINICAL DATA:  43 year old male with a history of 2 days upper abdominal pain and vomiting EXAM: CT ABDOMEN AND PELVIS WITH CONTRAST TECHNIQUE: Multidetector CT imaging of the abdomen and pelvis was performed using the standard protocol following bolus administration of intravenous contrast. CONTRAST:  100mL OMNIPAQUE IOHEXOL 300 MG/ML SOLN, 15mL ISOVUE-300 IOPAMIDOL (ISOVUE-300) INJECTION 61% COMPARISON:  07/22/2015 FINDINGS: Lower chest: No acute finding Hepatobiliary: Unremarkable appearance of liver parenchyma. Calcified cholelithiasis at the dependent aspect of the gallbladder with no pericholecystic fluid or inflammatory changes. The course of the common bile duct unremarkable with no intrahepatic or extrahepatic biliary ductal dilatation. Pancreas: Unremarkable Spleen: Unremarkable Adrenals/Urinary Tract: Unremarkable appearance of the adrenal glands. No evidence of hydronephrosis of the right or left kidney. No nephrolithiasis. Unremarkable course of the bilateral ureters. Unremarkable appearance of the urinary bladder. Stomach/Bowel: Unremarkable stomach. Unremarkable small bowel. No abnormal distention. No transition point. Normal appendix. Diverticular disease without evidence of acute inflammatory changes. No significant stool burden. Vascular/Lymphatic: No atherosclerotic changes. No aneurysm. Proximal femoral arteries patent. No adenopathy. Reproductive: Unremarkable pelvic organs.  Other: None Musculoskeletal: No acute displaced fracture. Scoliotic curvature again noted. No significant degenerative changes. IMPRESSION: No CT evidence of acute abnormality. Cholelithiasis without CT evidence of  acute cholecystitis. Diverticular disease. Electronically Signed   By: Gilmer Mor D.O.   On: 11/20/2018 10:24   Mr 3d Recon At Scanner  Result Date: 11/21/2018 CLINICAL DATA:  Abdominal pain for several months.  Cholelithiasis. EXAM: MRI ABDOMEN WITHOUT AND WITH CONTRAST (INCLUDING MRCP) TECHNIQUE: Multiplanar multisequence MR imaging of the abdomen was performed both before and after the administration of intravenous contrast. Heavily T2-weighted images of the biliary and pancreatic ducts were obtained, and three-dimensional MRCP images were rendered by post processing. CONTRAST:  10 mL Gadavist COMPARISON:  CT on 11/20/2018 FINDINGS: Lower chest: No acute findings. Hepatobiliary: No hepatic masses identified. Numerous tiny gallstones are seen, however there is no evidence of gallbladder wall thickening or pericholecystic inflammatory changes. Several small calculi are seen within the cystic duct. There is no evidence of biliary ductal dilatation which limits evaluation of the common bile duct, however there are several subtle filling defects in the distal common bile duct suspicious for choledocholithiasis. Pancreas: No mass or inflammatory changes. No evidence of pancreatic ductal dilatation. Spleen:  Within normal limits in size and appearance. Adrenals/Urinary Tract: No masses identified. No evidence of hydronephrosis. Stomach/Bowel: Visualized portion unremarkable. Vascular/Lymphatic: No pathologically enlarged lymph nodes identified. No abdominal aortic aneurysm. Other:  None. Musculoskeletal:  No suspicious bone lesions identified. IMPRESSION: Numerous tiny gallstones and several calculi seen in the cystic duct. No radiographic evidence of cholecystitis. No evidence of biliary ductal dilatation, however several tiny calculi are suspected in the distal common bile duct. Electronically Signed   By: Myles Rosenthal M.D.   On: 11/21/2018 08:31   Mr Abdomen Mrcp Vivien Rossetti Contast  Result Date: 11/21/2018 CLINICAL  DATA:  Abdominal pain for several months.  Cholelithiasis. EXAM: MRI ABDOMEN WITHOUT AND WITH CONTRAST (INCLUDING MRCP) TECHNIQUE: Multiplanar multisequence MR imaging of the abdomen was performed both before and after the administration of intravenous contrast. Heavily T2-weighted images of the biliary and pancreatic ducts were obtained, and three-dimensional MRCP images were rendered by post processing. CONTRAST:  10 mL Gadavist COMPARISON:  CT on 11/20/2018 FINDINGS: Lower chest: No acute findings. Hepatobiliary: No hepatic masses identified. Numerous tiny gallstones are seen, however there is no evidence of gallbladder wall thickening or pericholecystic inflammatory changes. Several small calculi are seen within the cystic duct. There is no evidence of biliary ductal dilatation which limits evaluation of the common bile duct, however there are several subtle filling defects in the distal common bile duct suspicious for choledocholithiasis. Pancreas: No mass or inflammatory changes. No evidence of pancreatic ductal dilatation. Spleen:  Within normal limits in size and appearance. Adrenals/Urinary Tract: No masses identified. No evidence of hydronephrosis. Stomach/Bowel: Visualized portion unremarkable. Vascular/Lymphatic: No pathologically enlarged lymph nodes identified. No abdominal aortic aneurysm. Other:  None. Musculoskeletal:  No suspicious bone lesions identified. IMPRESSION: Numerous tiny gallstones and several calculi seen in the cystic duct. No radiographic evidence of cholecystitis. No evidence of biliary ductal dilatation, however several tiny calculi are suspected in the distal common bile duct. Electronically Signed   By: Myles Rosenthal M.D.   On: 11/21/2018 08:31   US Abdomen Limited Ruq  Result Date: 11/20/2018 CLINICAL DATA:  Cholelithiasis and upper abdominal pain EXAM: ULTRASOUND ABDOMEN LIMITED RIGHT UPPER QUADRANT COMPARISON:  CT from earlier in the same day. FINDINGS: Gallbladder:  Gallbladder is well distended and demonstrates multiple gallstones. No  pericholecystic fluid or wall thickening is noted. Negative sonographic Eulah Pont sign is elicited. Common bile duct: Diameter: 3.3 mm. Liver: No focal lesion identified. Within normal limits in parenchymal echogenicity. Portal vein is patent on color Doppler imaging with normal direction of blood flow towards the liver. IMPRESSION: Cholelithiasis without complicating factors. Electronically Signed   By: Alcide Clever M.D.   On: 11/20/2018 11:35     .Ardeth Sportsman, M.D., F.A.C.S. Gastrointestinal and Minimally Invasive Surgery [facility] [address] [place], [place] [address] [phone] Main / Paging  11/23/2018 7:45 AM    Ardeth Sportsman

## 2018-11-23 NOTE — Transfer of Care (Signed)
Immediate Anesthesia Transfer of Care Note  Patient: Alejandro Willis  Procedure(s) Performed: LAPAROSCOPIC CHOLECYSTECTOMY WITH INTRAOPERATIVE CHOLANGIOGRAM (N/A ) ENDOSCOPIC RETROGRADE CHOLANGIOPANCREATOGRAPHY (ERCP) (N/A )  Patient Location: PACU  Anesthesia Type:General  Level of Consciousness: awake, alert  and oriented  Airway & Oxygen Therapy: Patient Spontanous Breathing and Patient connected to face mask oxygen  Post-op Assessment: Report given to RN and Post -op Vital signs reviewed and stable  Post vital signs: Reviewed and stable  Last Vitals:  Vitals Value Taken Time  BP 130/68 11/23/2018 12:30 PM  Temp 36.4 C 11/23/2018 12:30 PM  Pulse 73 11/23/2018 12:30 PM  Resp 20 11/23/2018 12:30 PM  SpO2 100 % 11/23/2018 12:30 PM  Vitals shown include unvalidated device data.  Last Pain:  Vitals:   11/23/18 0754  TempSrc: Oral  PainSc:       Patients Stated Pain Goal: 4 (11/23/18 0729)  Complications: No apparent anesthesia complications

## 2018-11-23 NOTE — Anesthesia Procedure Notes (Signed)
Procedure Name: Intubation Date/Time: 11/23/2018 8:28 AM Performed by: Maxwell Caul, CRNA Pre-anesthesia Checklist: Patient identified, Emergency Drugs available, Suction available and Patient being monitored Patient Re-evaluated:Patient Re-evaluated prior to induction Oxygen Delivery Method: Circle system utilized Preoxygenation: Pre-oxygenation with 100% oxygen Induction Type: IV induction and Rapid sequence Laryngoscope Size: Mac and 4 Grade View: Grade I Tube type: Oral Tube size: 7.5 mm Number of attempts: 1 Airway Equipment and Method: Stylet Placement Confirmation: ETT inserted through vocal cords under direct vision,  positive ETCO2 and breath sounds checked- equal and bilateral Secured at: 22 cm Tube secured with: Tape Dental Injury: Teeth and Oropharynx as per pre-operative assessment

## 2018-11-23 NOTE — Progress Notes (Signed)
PROGRESS NOTE  Alejandro Willis PPG:[id number] DOB: 1976-08-18 DOA: 11/20/2018 PCP: Patient, No Pcp Per   LOS: 2 days   Brief narrative: Patient is a 43 y.o. male with no past medical history who presented to the ED with hx of  abdominal pain and was found to have cholecystitis and choledocholithiasis.  Underwent ERCP and cholecystectomy today.  Subjective: Patient was seen and examined this afternoon.  Remains sleepy, still under the effect of anesthesia.  No complaint of pain.  Assessment/Plan:  Principal Problem:   Choledocholithiasis Active Problems:   Elevated LFTs   Transaminitis   Acute cholecystitis with chronic cholecystitis  Acute choledocholithiasis -GI and general surgery consulted. Underwent ERCP with removal of CBD stones and stent placement as well as lap chole with intraoperative cholangiogram today on 4/6.  -Monitor post op course  Elevated AST/ALT -Trending down. In setting of acute choledocholithiasis.  Marijuana use - Counseled.  GERD Hiatal hernia -Continue Pepcid  Mobility: Encourage ambulation Diet: Clear liquid diet. DVT prophylaxis:  SCDs Code Status:   Code Status: Full Code  Family Communication:  None at bedside. Disposition Plan:  Pending postoperative course  Consultants:  GI/surgery  Procedures:  Cholecystectomy with intraoperative cholangiogram.  ERCP with CBD stent removal and stenting  Antimicrobials:  Anti-infectives (From admission, onward)   Start     Dose/Rate Route Frequency Ordered Stop   11/20/18 2245  [MAR Hold]  Ampicillin-Sulbactam (UNASYN) 3 g in sodium chloride 0.9 % 100 mL IVPB     (MAR Hold since Mon 11/23/2018 at 0719. Reason: Transfer to a Procedural area.)   3 g 200 mL/hr over 30 Minutes Intravenous Every 6 hours 11/20/18 2224     11/20/18 2230  levofloxacin (LEVAQUIN) IVPB 500 mg  Status:  Discontinued     500 mg 100 mL/hr over 60 Minutes Intravenous Every 24 hours 11/20/18 2223 11/20/18 2223      Infusions:  . sodium chloride    . [MAR Hold] ampicillin-sulbactam (UNASYN) IV 3 g (11/23/18 0622)  . [MAR Hold] famotidine (PEPCID) IV 20 mg (11/22/18 2035)  . lactated ringers 125 mL/hr at 11/23/18 0622  . lactated ringers 50 mL/hr at 11/23/18 0728    Scheduled Meds:   PRN meds: [MAR Hold] bisacodyl, fentaNYL (SUBLIMAZE) injection, [MAR Hold] hydrOXYzine, [MAR Hold] ondansetron (ZOFRAN) IV, ondansetron (ZOFRAN) IV, [MAR Hold] oxyCODONE, oxyCODONE **OR** oxyCODONE   Objective: Vitals:   11/23/18 1330 11/23/18 1345  BP: 133/90 132/81  Pulse: 70 62  Resp: 18 17  Temp:  (!) 97.5 F (36.4 C)  SpO2: 99% 100%    Intake/Output Summary (Last 24 hours) at 11/23/2018 1350 Last data filed at 11/23/2018 1315 Gross per 24 hour  Intake 200 ml  Output 275 ml  Net -75 ml   Filed Weights   11/20/18 0825 11/23/18 0729  Weight: 90.7 kg 90.7 kg   Weight change:  Body mass index is 32.28 kg/m.   Physical Exam: General exam: Appears calm and comfortable.  Remains sleepy Skin: No rashes, lesions or ulcers. HEENT: Normal Lungs: Clear to auscultation bilaterally CVS: Regular rate and rhythm, no murmur GI/Abd soft, mild appropriate postoperative tenderness, bowel sounds sluggish CNS: Opens eyes on verbal command, slowly answers appropriately Psychiatry: Mood & affect appropriate.  Extremities: No pedal edema, no calf tenderness  Data Review: I have personally reviewed the laboratory data and studies available.  Recent Labs  Lab 11/20/18 0910 11/22/18 0529  WBC 5.7 4.6  HGB 15.4 13.6  HCT 49.8 44.2  MCV 78.9* 80.5  PLT 205 193   Recent Labs  Lab 11/20/18 0910 11/20/18 2215 11/21/18 0535 11/22/18 0529 11/23/18 0437  NA 136  --  138 138 137  K 3.2* 3.8 3.9 3.6 3.6  CL 102  --  105 103 103  CO2 26  --  25 27 25   GLUCOSE 124*  --  97 94 86  BUN 5*  --  <5* <5* 6  CREATININE 0.81  --  0.76 0.89 0.94  CALCIUM 9.0  --  8.7* 8.8* 8.9   Lorin Glass, MD  [facility]  11/23/2018

## 2018-11-23 NOTE — Op Note (Signed)
[facility] Patient Name: Alejandro Willis Procedure Date: 11/23/2018 MRN: [id number] Attending MD: Vida Rigger , MD Date of Birth: 20-Feb-1976 CSN: [id number] Age: 43 Admit Type: Inpatient Procedure:                ERCP Indications:              Bile duct stone(s) on MRCP in patient with elevated                            liver tests Providers:                Vida Rigger, MD, Dwain Sarna, RN, Beryle Beams,                            Technician, Leroy Libman, CRNA Referring MD:              Medicines:                General Anesthesia Complications:            No immediate complications. Estimated Blood Loss:     Estimated blood loss: none. Procedure:                Pre-Anesthesia Assessment:                           - Prior to the procedure, a History and Physical                            was performed, and patient medications and                            allergies were reviewed. The patient's tolerance of                            previous anesthesia was also reviewed. The risks                            and benefits of the procedure and the sedation                            options and risks were discussed with the patient.                            All questions were answered, and informed consent                            was obtained. Prior Anticoagulants: The patient has                            taken no previous anticoagulant or antiplatelet                            agents. ASA Grade Assessment: I - A normal, healthy  patient. After reviewing the risks and benefits,                            the patient was deemed in satisfactory condition to                            undergo the procedure.                           After obtaining informed consent, the scope was                            passed under direct vision. Throughout the                            procedure, the patient's blood pressure, pulse, and                          oxygen saturations were monitored continuously. The                            [id number]-[id number] ([id number] Olympus duodenoscope was                            introduced through the mouth, and used to inject                            contrast into and used to cannulate the bile duct.                            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                            Successful completion of the procedure was aided by                            performing the maneuvers documented (below) in this                            report. The patient tolerated the procedure well. Scope In: Scope Out: Findings:      The major papilla was normal. We initially used the regular       sphincterotome but then had to switch to the smaller sphincterotome and       we were able to cannulate the pancreatic duct and minimal dye was       injected to confirm and we placed one 4 Fr by 3 cm plastic stent with a       3/4 external pigtail and no internal flaps was placed 2.5 cm into the       ventral pancreatic duct. Clear fluid flowed through the stent. The stent       was in good position. We  initially were unsuccessful cannulating next to       the pancreatic duct stent so we proceeded with a biliary pre-cut       sphincterotomy was made with a needle knife using a freehand technique       using ERBE electrocautery. Unfortunately we still could not advance the       wire into the CBD and minor bleeding from the sphincterotomy was       successfully treated using 1-10,000 epi first with washing then with 2       cc injection. And the bleeding stopped and we retry it to cannulate next       to the stent this time it was successful and we proceeded with a second       biliary sphincterotomy was made with a Hydratome sphincterotome using       ERBE electrocautery. There was no post-sphincterotomy bleeding. The        biliary tree was swept with a 9 mm balloon starting at the bifurcation       multiple times. All stones were removed and all stones were flecks and       tiny. Nothing was found on a few occlusion cholangiograms. We did have       some difficulty pulling the 9 mm balloon through the most distal part of       the duct but no residual stone was seen and there was adequate biliary       drainage unfortunately we did not have the injectable low balloon       available to US Impression:               - The major papilla appeared normal.                           - Choledocholithiasis was found. Complete removal                            was accomplished by biliary sphincterotomy and                            balloon extraction.                           - One plastic stent was placed into the ventral                            pancreatic duct.                           - A biliary sphincterotomy was performed first                            using needle-knife technique.                           - A biliary sphincterotomy was performed using                            conventional sphincterotome.                           -  The biliary tree was swept and nothing was found                            at the end of the procedure. Moderate Sedation:      Not Applicable - Patient had care per Anesthesia. Recommendation:           - Clear liquid diet today and advance as tolerated                            as per surgical team.                           - Continue present medications.                           - Check liver enzymes (AST, ALT, alkaline                            phosphatase, bilirubin) tomorrow. Follow back to                            normal as an outpatient                           - Perform a flat plate and upright abdominal x-ray                            in 1 week to confirm pancreatic duct stent passing.                           - Return to GI clinic PRN.                            - Telephone GI clinic if symptomatic PRN. I have                            asked the surgeon to proceed with an Intra-Op                            cholangiogram possibly to get better pictures of                            the distal duct to make sure no residual stricture                            or stone present Procedure Code(s):        --- Professional ---                           [phone], Endoscopic retrograde                            cholangiopancreatography (ERCP); with placement of  endoscopic stent into biliary or pancreatic duct,                            including pre- and post-dilation and guide wire                            passage, when performed, including sphincterotomy,                            when performed, each stent                           43264, Endoscopic retrograde                            cholangiopancreatography (ERCP); with removal of                            calculi/debris from biliary/pancreatic duct(s)                           43262, 59, Endoscopic retrograde                            cholangiopancreatography (ERCP); with                            sphincterotomy/papillotomy Diagnosis Code(s):        --- Professional ---                           K80.50, Calculus of bile duct without cholangitis                            or cholecystitis without obstruction CPT copyright 2019 American Medical Association. All rights reserved. The codes documented in this report are preliminary and upon coder review may  be revised to meet current compliance requirements. Vida RiggerMarc Maryhelen Lindler, MD 11/23/2018 11:12:11 AM This report has been signed electronically. Number of Addenda: 0

## 2018-11-23 NOTE — Anesthesia Preprocedure Evaluation (Addendum)
Anesthesia Evaluation  Patient identified by MRN, date of birth, ID band Patient awake    Reviewed: Allergy & Precautions, NPO status , Patient's Chart, lab work & pertinent test results  History of Anesthesia Complications Negative for: history of anesthetic complications  Airway Mallampati: II  TM Distance: >3 FB Neck ROM: Full    Dental no notable dental hx.    Pulmonary neg pulmonary ROS,    Pulmonary exam normal        Cardiovascular negative cardio ROS Normal cardiovascular exam     Neuro/Psych negative neurological ROS  negative psych ROS   GI/Hepatic hiatal hernia, (+)     substance abuse  marijuana use, Choledocholithiasis   Endo/Other  negative endocrine ROS  Renal/GU negative Renal ROS  negative genitourinary   Musculoskeletal negative musculoskeletal ROS (+)   Abdominal   Peds  Hematology negative hematology ROS (+)   Anesthesia Other Findings   Reproductive/Obstetrics                            Anesthesia Physical Anesthesia Plan  ASA: II  Anesthesia Plan: General   Post-op Pain Management:    Induction: Intravenous and Rapid sequence  PONV Risk Score and Plan: 2 and Ondansetron, Dexamethasone, Midazolam and Treatment may vary due to age or medical condition  Airway Management Planned: Oral ETT  Additional Equipment: None  Intra-op Plan:   Post-operative Plan: Extubation in OR  Informed Consent: I have reviewed the patients History and Physical, chart, labs and discussed the procedure including the risks, benefits and alternatives for the proposed anesthesia with the patient or authorized representative who has indicated his/her understanding and acceptance.     Dental advisory given  Plan Discussed with:   Anesthesia Plan Comments:        Anesthesia Quick Evaluation

## 2018-11-23 NOTE — Progress Notes (Signed)
Ad Abdus-Salaam 8:12 AM  Subjective: Patient without any complaints and hospital computer chart reviewed and case discussed with my partner Dr. Evette Cristal Objective: Vital signs stable afebrile no acute distress exam please see preassessment evaluation LFTs increased CBC okay MRCP reviewed  Assessment: CBD stones  Plan: The risks benefits methods of ERCP was rediscussed with the patient and will proceed today with anesthesia assistance  [facility]  Pager [phone] After 5PM or if no answer call [phone]

## 2018-11-23 NOTE — Anesthesia Postprocedure Evaluation (Signed)
Anesthesia Post Note  Patient: Alejandro Willis  Procedure(s) Performed: LAPAROSCOPIC CHOLECYSTECTOMY WITH INTRAOPERATIVE CHOLANGIOGRAM (N/A ) ENDOSCOPIC RETROGRADE CHOLANGIOPANCREATOGRAPHY (ERCP) (N/A )     Patient location during evaluation: PACU Anesthesia Type: General Level of consciousness: awake and alert Pain management: pain level controlled Vital Signs Assessment: post-procedure vital signs reviewed and stable Respiratory status: spontaneous breathing, nonlabored ventilation and respiratory function stable Cardiovascular status: blood pressure returned to baseline and stable Postop Assessment: no apparent nausea or vomiting Anesthetic complications: no    Last Vitals:  Vitals:   11/23/18 1345 11/23/18 1404  BP: 132/81 137/79  Pulse: 62 67  Resp: 17 16  Temp: (!) 36.4 C   SpO2: 100% 99%    Last Pain:  Vitals:   11/23/18 1345  TempSrc:   PainSc: Asleep                 Lucretia Kern

## 2018-11-23 NOTE — Op Note (Signed)
11/23/2018  PATIENT:  Alejandro Willis  43 y.o. male  Patient Care Team: Patient, No Pcp Per as PCP - General (General Practice)  PRE-OPERATIVE DIAGNOSIS:    Acute on Chronic Calculus Cholecystitis  Choledocolithiasis s/p ERCP & pancreatic stent  POST-OPERATIVE DIAGNOSIS:   Acute on Chronic Calculus Cholecystitis  Choledocolithiasis s/p ERCP & pancreatic stent - persisitent   PROCEDURE:  SINGLE SITE Laparoscopic cholecystectomy with intraoperative cholangiogram  SURGEON:  Ardeth Sportsman, MD, FACS.  ASSISTANT: Lyn Henri, PA-C  ANESTHESIA:    General with endotracheal intubation Local anesthetic as a field block  EBL:  (See Anesthesia Intraoperative Record) Total I/O In: -  Out: 100 [Urine:100]  Delay start of Pharmacological VTE agent (>24hrs) due to surgical blood loss or risk of bleeding:  no  DRAINS: None   SPECIMEN: Gallbladder    DISPOSITION OF SPECIMEN:  PATHOLOGY  COUNTS:  YES  PLAN OF CARE: Admit to inpatient   PATIENT DISPOSITION:  PACU - hemodynamically stable.  INDICATION: Pleasant patient with episode of biliary colic and hyperbilirubinemia.  Concern for choledocholithiasis on MRCP.  He was admitted and stabilized.  Recommendation made for combined ERCP and cholecystectomy.    The anatomy & physiology of hepatobiliary & pancreatic function was discussed.  The pathophysiology of gallbladder dysfunction was discussed.  Natural history risks without surgery was discussed.   I feel the risks of no intervention will lead to serious problems that outweigh the operative risks; therefore, I recommended cholecystectomy to remove the pathology.  I explained laparoscopic techniques with possible need for an open approach.  Probable cholangiogram to evaluate the bilary tract was explained as well.    Risks such as bleeding, infection, abscess, leak, injury to other organs, need for further treatment, heart attack, death, and other risks were discussed.  I noted  a good likelihood this will help address the problem.  Possibility that this will not correct all abdominal symptoms was explained.  Goals of post-operative recovery were discussed as well.  We will work to minimize complications.  An educational handout further explaining the pathology and treatment options was given as well.  Questions were answered.  The patient expresses understanding & wishes to proceed with surgery.  Patient underwent ERCP by Dr. Ewing Schlein with [facility] gastroenterology.  Choledocholithiasis noted.  Hard to sweep all stones out.  Pancreatic stent placed given some biliary inflammation.  No bile duct stent placed.  Request made for cholangiography   OR FINDINGS: Edema gallbladder wall thickening consistent with acute cholecystitis.  Some chronic thickening and discoloration consistent with chronic cholecystitis.  Moderate amount of cholelithiasis.  Inflamed cystic duct.  Cholangiogram notes intraluminal hyperlucencies up in the proximal intrahepatic bile duct consistent with choledocholithiasis.  Some in the distant common bile duct.  Slow trickling into the duodenum.  Prior contrast from prior ERCP noted.  Held off on doing more aggressive bile duct dissection at this time.  Liver: normal  DESCRIPTION:   The patient was identified & brought in the operating room. The patient was positioned supine with arms tucked. SCDs were active during the entire case. The patient underwent general anesthesia without any difficulty.  The abdomen was prepped and draped in a sterile fashion. A Surgical Timeout confirmed our plan.  I made a transverse curvilinear incision through the superior umbilical fold.  I placed a 5mm long port through the supraumbilical fascia using a modified Hassan cutdown technique with umbilical stalk fascial countertraction. I began carbon dioxide insufflation.  No change in end tidal CO2  measurement.   Camera inspection revealed no injury. There were no adhesions to the  anterior abdominal wall supraumbilically.  I proceeded to continue with single site technique. I placed a #5 port in left upper aspect of the wound. I placed a 5 mm atraumatic grasper in the right inferior aspect of the wound.  I turned attention to the right upper quadrant.  Gallbladder was edematous.  Somewhat lengthened and folded upon itself.  The gallbladder fundus was elevated cephalad. I freed adhesions to the ventral surface of the gallbladder off carefully.  I freed the peritoneal coverings between the gallbladder and the liver on the posteriolateral and anteriomedial walls. I alternated between Harmonic & blunt Maryland dissection to help get a good critical view of the cystic artery and cystic duct.  I did further dissection to free 80% of the gallbladder off the liver bed to get a good critical view of the infundibulum and cystic duct. I dissected out the lymphatics around the lymph node of Calot & cystic artery; and, after getting a good 360 view, ligated the anterior & posterior branches of the cystic artery close on the infundibulum using the Harmonic ultrasonic dissection.  There was some bilious staining around the infundibulum and proximal cystic duct.  I skeletonized the cystic duct.  Was rather inflamed.  Was a small opening in the mid cystic duct.  Hard to know if that was there beforehand or due to my dissection.  Did express some thin granular material and bile.  I took the gallbladder from its final attachments on the liver so that was completely freed off and skeletonized the cystic duct to see the cystic and common bile duct junction to confirm that the hole was halfway between the infundibulum and cystic/common bile duct junction.  About 15 mm distal to that junction I placed a 5 [place] cholangiocatheter through a puncture site at the right subcostal ridge of the abdominal wall and directed it into the cystic duct.  We ran a cholangiogram with dilute radio-opaque contrast and  continuous fluoroscopy. Contrast flowed from a side branch consistent with cystic duct cannulization. Contrast flowed up the common hepatic duct into the right and left intrahepatic chains out to secondary radicals. Contrast flowed down the common bile duct gradually.  Try to do slow flow to avoid any pressure leaking.  There were some intraluminal hyperlucencies up in the common hepatic duct suspicious for some persistent small common bile duct stones.  Another smaller 1 more distally towards the ampulla.  I thought I could see a little bit wisps of contrast going into the duodenal sweep.  I held off on doing more aggressive dissection or, bile duct dissection at this time.  I removed the cholangiocatheter.  I placed a 0 PDS Endoloop and lassoed around the entire gallbladder coming proximally and ligated at the cystic/common bile duct junction.  I placed another Endoloop 1 mm more distally to get to good ligations between the ductotomy of the cystic duct and the cystic/common bile duct junction.  I placed a few clips as well.  I completed cystic duct transection closer on the infundibular side.  I placed the gallbladder inside and Eco-sac bag.  I ensured hemostasis on the gallbladder fossa of the liver and elsewhere. I inspected the rest of the abdomen & detected no injury nor bleeding elsewhere.  Ligation intact on the cystic duct stump with no leaking of bile.  No bleeding.  Liver looked normal.  I removed the gallbladder out the supraumbilical  fascia after dilating it to 2 cm to get the gallbladder and its thickened wall and numerous stones out.  I closed the fascia transversely using  #1 PDS interrupted stitches. I closed the skin using 4-0 monocryl stitch.  Sterile dressing was applied. The patient was extubated & arrived in the PACU in stable condition..  I had discussed postoperative care with the patient in the holding area. I discussed operative findings, updated the patient's status, discussed  probable steps to recovery, and gave postoperative recommendations to the patient's spouse, Philshaunda.   Recommendations were made.  Questions were answered.  She expressed understanding & appreciation.  We will follow him clinically along with his liver numbers.  If liver numbers normalized and his pain is less, hopefully it means the remaining few stones have passed and he does not require further procedures.  I did caution the patient's wife that the patient still may need another repeat ERCP or even another operation if the stones were not finally passed.  Hopefully not too likely with sphincterotomy and cholecystectomy done.  Ardeth Sportsman, M.D., F.A.C.S. Gastrointestinal and Minimally Invasive [facility] [address] [place], [place] [address] [phone] Main / Paging  11/23/2018 12:20 PM

## 2018-11-24 ENCOUNTER — Encounter (HOSPITAL_COMMUNITY): Payer: Self-pay | Admitting: Surgery

## 2018-11-24 LAB — CBC
HCT: 42.2 % (ref 39.0–52.0)
Hemoglobin: 13.3 g/dL (ref 13.0–17.0)
MCH: 24.9 pg — ABNORMAL LOW (ref 26.0–34.0)
MCHC: 31.5 g/dL (ref 30.0–36.0)
MCV: 78.9 fL — ABNORMAL LOW (ref 80.0–100.0)
Platelets: 175 10*3/uL (ref 150–400)
RBC: 5.35 MIL/uL (ref 4.22–5.81)
RDW: 15.6 % — ABNORMAL HIGH (ref 11.5–15.5)
WBC: 9.1 10*3/uL (ref 4.0–10.5)
nRBC: 0 % (ref 0.0–0.2)

## 2018-11-24 LAB — COMPREHENSIVE METABOLIC PANEL
ALT: 523 U/L — ABNORMAL HIGH (ref 0–44)
AST: 306 U/L — ABNORMAL HIGH (ref 15–41)
Albumin: 3.4 g/dL — ABNORMAL LOW (ref 3.5–5.0)
Alkaline Phosphatase: 129 U/L — ABNORMAL HIGH (ref 38–126)
Anion gap: 11 (ref 5–15)
BUN: 8 mg/dL (ref 6–20)
CO2: 24 mmol/L (ref 22–32)
Calcium: 8.7 mg/dL — ABNORMAL LOW (ref 8.9–10.3)
Chloride: 100 mmol/L (ref 98–111)
Creatinine, Ser: 0.74 mg/dL (ref 0.61–1.24)
GFR calc Af Amer: >60 mL/min (ref >60)
GFR calc non Af Amer: >60 mL/min (ref >60)
Glucose, Bld: 101 mg/dL — ABNORMAL HIGH (ref 70–99)
Potassium: 3.6 mmol/L (ref 3.5–5.1)
Sodium: 135 mmol/L (ref 135–145)
Total Bilirubin: 5.8 mg/dL — ABNORMAL HIGH (ref 0.3–1.2)
Total Protein: 6.8 g/dL (ref 6.5–8.1)

## 2018-11-24 LAB — MAGNESIUM: Magnesium: 2 mg/dL (ref 1.7–2.4)

## 2018-11-24 LAB — LIPASE, BLOOD: Lipase: 383 U/L — ABNORMAL HIGH (ref 11–51)

## 2018-11-24 NOTE — Progress Notes (Signed)
[facility] PROGRESS NOTE Subjective Patient had ERCP, ERS with stone removal, placement of pancreatic stent and cholecystectomy yesterday.  He is still feeling some discomfort having a lot of burping not really passing much in the way of air.  LFTs up just a little bit.  Overall feels better.  Objective: Vital signs in last 24 hours: Temp:  [97.5 F (36.4 C)-98.9 F (37.2 C)] 98.8 F (37.1 C) (04/07 0451) Pulse Rate:  [62-80] 74 (04/07 0451) Resp:  [16-22] 16 (04/07 0451) BP: (130-158)/(74-90) 141/77 (04/07 0451) SpO2:  [97 %-100 %] 98 % (04/07 0451) Last BM Date: 11/21/18  Intake/Output from previous day: 04/06 0701 - 04/07 0700 In: 4285.9 [P.O.:900; I.V.:3285.9; IV Piggyback:100] Out: 1555 [Urine:1530; Blood:25] Intake/Output this shift: Total I/O In: -  Out: 700 [Urine:700]  PE: General--patient alert and oriented  Abdomen--slight distention, mild upper tenderness few bowel sounds  Lab Results: Recent Labs    11/22/18 0529 11/23/18 2033 11/24/18 0517  WBC 4.6 7.4 9.1  HGB 13.6 13.9 13.3  HCT 44.2 43.5 42.2  PLT 193 180 175   BMET Recent Labs    11/22/18 0529 11/23/18 0437 11/24/18 0517  NA 138 137 135  K 3.6 3.6 3.6  CL 103 103 100  CO2 27 25 24   CREATININE 0.89 0.94 0.74   LFT Recent Labs    11/22/18 0529 11/23/18 0437 11/24/18 0517  PROT 7.0 7.2 6.8  AST 149* 162* 306*  ALT 440* 393* 523*  ALKPHOS 121 118 129*  BILITOT 4.4* 4.9* 5.8*   PT/INR No results for input(s): LABPROT, INR in the last 72 hours. PANCREAS Recent Labs    11/24/18 0517  LIPASE 383*         Studies/Results: Dg Cholangiogram Operative  Result Date: 11/23/2018 CLINICAL DATA:  Cholelithiasis EXAM: INTRAOPERATIVE CHOLANGIOGRAM TECHNIQUE: Cholangiographic images from the C-arm fluoroscopic device were submitted for interpretation post-operatively. Please see the procedural report for the amount of contrast and the fluoroscopy time utilized. COMPARISON:   None. FINDINGS: Contrast fills the biliary tree without filling defects in the common bile duct. Contrast does not reach the duodenum. IMPRESSION: Contrast fills the biliary tree but not the duodenum. Biliary obstruction is not excluded. Electronically Signed   By: Jolaine Click M.D.   On: 11/23/2018 11:58   Dg Ercp Biliary & Pancreatic Ducts  Result Date: 11/23/2018 CLINICAL DATA:  Gallstones EXAM: ERCP TECHNIQUE: Multiple spot images obtained with the fluoroscopic device and submitted for interpretation post-procedure. FLUOROSCOPY TIME:  Fluoroscopy Time:  11 minutes and 41 seconds. Radiation Exposure Index (if provided by the fluoroscopic device): Number of Acquired Spot Images: 0 COMPARISON:  None. FINDINGS: Images demonstrate cannulation of the common bile duct and contrast filling the biliary tree. IMPRESSION: See above. These images were submitted for radiologic interpretation only. Please see the procedural report for the amount of contrast and the fluoroscopy time utilized. Electronically Signed   By: Jolaine Click M.D.   On: 11/23/2018 11:05    Medications: I have reviewed the patient's current medications.  Assessment:   1.  Cholecystitis with CBD stones.  Status post ERCP, sphincterotomy and stone extraction and cholecystectomy yesterday.   Plan:  Would slowly advance diet.  Patient will need to follow-up with Dr. Ewing Schlein in approximately 3 weeks to assess whether or not the pancreatic stent needs to be removed.  We will follow from a distance and be available if needed please give [place] a call.   Tresea Mall 11/24/2018, 12:53 PM  This  note was created using voice recognition software. Minor errors may Have occurred unintentionally.  Pager: [phone] If no answer or after hours call [phone]

## 2018-11-24 NOTE — Progress Notes (Signed)
PROGRESS NOTE  Kiven Abdus-Salaam QJF:[id number] DOB: 1975-10-16 DOA: 11/20/2018 PCP: Patient, No Pcp Per   LOS: 3 days   Brief narrative: Patient is a 43 y.o.male with no past medical history who presented to the ED with hx of  abdominal pain and was found to have cholecystitis and choledocholithiasis.  Underwent ERCP and cholecystectomy today.  Subjective: Patient was seen and examined this morning.  Pleasant male.  Does not have abdominal pain.  Complains of some discomfort.  No nausea.  Vomiting.  Blood work from this morning noted to have elevated lipase level at rest liver enzymes and bilirubin.  Assessment/Plan:  Principal Problem:   Choledocholithiasis Active Problems:   Elevated LFTs   Transaminitis   Acute cholecystitis with chronic cholecystitis  Acute choledocholithiasis - GI and general surgery consulted. Underwent ERCP with removal of CBD stones and stent placement as well as lap chole with intraoperative cholangiogram today on 4/6.  -Postoperatively, patient does not have significant abdominal pain, nausea or vomiting.  However lipase level and liver enzymes are up this morning.  Patient currently remains n.p.o.  GI to follow.  Repeat liver enzymes and lipase tomorrow. -Monitor post op course  Elevated AST/ALT -Mildly elevated on admission.  Post ERCP, liver enzymes are trending up.  To follow-up.  Marijuana use - Counseled.  GERD Hiatal hernia -Continue Pepcid  Mobility: Encourage ambulation Diet:  N.p.o. as of now. DVT prophylaxis: SCDs Code Status:  Code Status: Full Code  Family Communication: None at bedside. Disposition Plan: Pending postoperative course  Consultants:  GI/surgery  Procedures:  Cholecystectomy with intraoperative cholangiogram.  ERCP with CBD stent removal and stenting   Antimicrobials:  Anti-infectives (From admission, onward)   Start     Dose/Rate Route Frequency Ordered Stop   11/20/18 2245   Ampicillin-Sulbactam (UNASYN) 3 g in sodium chloride 0.9 % 100 mL IVPB  Status:  Discontinued     3 g 200 mL/hr over 30 Minutes Intravenous Every 6 hours 11/20/18 2224 11/23/18 1416   11/20/18 2230  levofloxacin (LEVAQUIN) IVPB 500 mg  Status:  Discontinued     500 mg 100 mL/hr over 60 Minutes Intravenous Every 24 hours 11/20/18 2223 11/20/18 2223      Infusions:  . famotidine (PEPCID) IV 20 mg (11/23/18 1602)  . lactated ringers    . lactated ringers 125 mL/hr at 11/23/18 0622  . ondansetron (ZOFRAN) IV      Scheduled Meds: . acetaminophen  1,000 mg Oral Q6H  . gabapentin  300 mg Oral BID  . ibuprofen  600 mg Oral TID  . lip balm  1 application Topical BID  . loratadine  10 mg Oral Daily  . pantoprazole (PROTONIX) IV  40 mg Intravenous Q24H  . psyllium  1 packet Oral Daily    PRN meds: alum & mag hydroxide-simeth, bisacodyl, bisacodyl, guaiFENesin-dextromethorphan, hydrALAZINE, hydrocortisone, hydrocortisone cream, HYDROmorphone (DILAUDID) injection, hydrOXYzine, lactated ringers, magic mouthwash, menthol-cetylpyridinium, metoprolol tartrate, morphine injection, ondansetron (ZOFRAN) IV **OR** ondansetron (ZOFRAN) IV, oxyCODONE, phenol, polyethylene glycol, prochlorperazine, traMADol   Objective: Vitals:   11/23/18 2041 11/24/18 0451  BP: 130/74 (!) 141/77  Pulse: 72 74  Resp: 16 16  Temp: 98.9 F (37.2 C) 98.8 F (37.1 C)  SpO2: 99% 98%    Intake/Output Summary (Last 24 hours) at 11/24/2018 1008 Last data filed at 11/24/2018 0452 Gross per 24 hour  Intake 4285.92 ml  Output 1455 ml  Net 2830.92 ml   Filed Weights   11/20/18 0825 11/23/18 0729  Weight: 90.7  kg 90.7 kg   Weight change:  Body mass index is 32.28 kg/m.   Physical Exam: General exam: Appears calm and comfortable.  Skin: No rashes, lesions or ulcers. HEENT: Normal Lungs: Clear to auscultation bilaterally CVS: Regular rate and rhythm, no murmur GI/Abd soft, nondistended, mild appropriate post  surgical tenderness, bowel sound present CNS: Alert, awake oriented x3 Psychiatry: Mood & affect appropriate.  Extremities: No pedal edema, no calf tenderness  Data Review: I have personally reviewed the laboratory data and studies available.  Recent Labs  Lab 11/20/18 0910 11/22/18 0529 11/23/18 2033 11/24/18 0517  WBC 5.7 4.6 7.4 9.1  NEUTROABS  --   --  6.2  --   HGB 15.4 13.6 13.9 13.3  HCT 49.8 44.2 43.5 42.2  MCV 78.9* 80.5 78.8* 78.9*  PLT 205 193 180 175   Recent Labs  Lab 11/20/18 0910 11/20/18 2215 11/21/18 0535 11/22/18 0529 11/23/18 0437 11/24/18 0517  NA 136  --  138 138 137 135  K 3.2* 3.8 3.9 3.6 3.6 3.6  CL 102  --  105 103 103 100  CO2 26  --  25 27 25 24   GLUCOSE 124*  --  97 94 86 101*  BUN 5*  --  <5* <5* 6 8  CREATININE 0.81  --  0.76 0.89 0.94 0.74  CALCIUM 9.0  --  8.7* 8.8* 8.9 8.7*  MG  --   --   --   --   --  2.0   Recent Labs  Lab 11/20/18 1737 11/21/18 0535 11/22/18 0529 11/23/18 0437 11/24/18 0517  AST 337* 206* 149* 162* 306*  ALT 691* 534* 440* 393* 523*  ALKPHOS 118 110 121 118 129*  BILITOT 5.0* 4.3* 4.4* 4.9* 5.8*  PROT 7.7 6.8 7.0 7.2 6.8  ALBUMIN 4.1 3.5 3.7 3.7 3.4*    Lorin GlassBinaya Derrion Tritz, MD  [facility] 11/24/2018

## 2018-11-24 NOTE — Progress Notes (Signed)
1 Day Post-Op  Subjective: CC: Abdominal pain, nausea and emesis Patient reports improved abdominal pain since surgery. Continues to have some soreness of his upper abdomen and also around his laparoscopic incision around the umbilicus. He reports last night he had some nausea as well as one episode of blood tinged emesis. No further episodes of emesis and his nausea has resolved. On a CLD this AM and tolerating. He has not ambulated yet. He has voided.    Objective: Vital signs in last 24 hours: Temp:  [97.5 F (36.4 C)-98.9 F (37.2 C)] 98.8 F (37.1 C) (04/07 0451) Pulse Rate:  [62-80] 74 (04/07 0451) Resp:  [16-22] 16 (04/07 0451) BP: (130-158)/(68-90) 141/77 (04/07 0451) SpO2:  [97 %-100 %] 98 % (04/07 0451) Last BM Date: 11/21/18  Intake/Output from previous day: 04/06 0701 - 04/07 0700 In: 4285.9 [P.O.:900; I.V.:3285.9; IV Piggyback:100] Out: 1555 [Urine:1530; Blood:25] Intake/Output this shift: No intake/output data recorded.  PE: Gen: Awake and alert, NAD Heart: RRR Lungs: CTA b/l, normal effort Abd: Soft, ND, mild tenderness of epigastrium, RUQ and around umbilical laparoscopic incision. Tegaderm in place over umbilical incisions. +BS Msk: No edema  Lab Results:  Recent Labs    11/23/18 2033 11/24/18 0517  WBC 7.4 9.1  HGB 13.9 13.3  HCT 43.5 42.2  PLT 180 175   BMET Recent Labs    11/23/18 0437 11/24/18 0517  NA 137 135  K 3.6 3.6  CL 103 100  CO2 25 24  GLUCOSE 86 101*  BUN 6 8  CREATININE 0.94 0.74  CALCIUM 8.9 8.7*   PT/INR No results for input(s): LABPROT, INR in the last 72 hours. CMP     Component Value Date/Time   NA 135 11/24/2018 0517   K 3.6 11/24/2018 0517   CL 100 11/24/2018 0517   CO2 24 11/24/2018 0517   GLUCOSE 101 (H) 11/24/2018 0517   BUN 8 11/24/2018 0517   CREATININE 0.74 11/24/2018 0517   CALCIUM 8.7 (L) 11/24/2018 0517   PROT 6.8 11/24/2018 0517   ALBUMIN 3.4 (L) 11/24/2018 0517   AST 306 (H) 11/24/2018 0517    ALT 523 (H) 11/24/2018 0517   ALKPHOS 129 (H) 11/24/2018 0517   BILITOT 5.8 (H) 11/24/2018 0517   GFRNONAA >60 11/24/2018 0517   GFRAA >60 11/24/2018 0517   Lipase     Component Value Date/Time   LIPASE 383 (H) 11/24/2018 0517       Studies/Results: Dg Cholangiogram Operative  Result Date: 11/23/2018 CLINICAL DATA:  Cholelithiasis EXAM: INTRAOPERATIVE CHOLANGIOGRAM TECHNIQUE: Cholangiographic images from the C-arm fluoroscopic device were submitted for interpretation post-operatively. Please see the procedural report for the amount of contrast and the fluoroscopy time utilized. COMPARISON:  None. FINDINGS: Contrast fills the biliary tree without filling defects in the common bile duct. Contrast does not reach the duodenum. IMPRESSION: Contrast fills the biliary tree but not the duodenum. Biliary obstruction is not excluded. Electronically Signed   By: Jolaine Click M.D.   On: 11/23/2018 11:58   Dg Ercp Biliary & Pancreatic Ducts  Result Date: 11/23/2018 CLINICAL DATA:  Gallstones EXAM: ERCP TECHNIQUE: Multiple spot images obtained with the fluoroscopic device and submitted for interpretation post-procedure. FLUOROSCOPY TIME:  Fluoroscopy Time:  11 minutes and 41 seconds. Radiation Exposure Index (if provided by the fluoroscopic device): Number of Acquired Spot Images: 0 COMPARISON:  None. FINDINGS: Images demonstrate cannulation of the common bile duct and contrast filling the biliary tree. IMPRESSION: See above. These images were submitted  for radiologic interpretation only. Please see the procedural report for the amount of contrast and the fluoroscopy time utilized. Electronically Signed   By: Jolaine ClickArthur  Hoss M.D.   On: 11/23/2018 11:05    Anti-infectives: Anti-infectives (From admission, onward)   Start     Dose/Rate Route Frequency Ordered Stop   11/20/18 2245  Ampicillin-Sulbactam (UNASYN) 3 g in sodium chloride 0.9 % 100 mL IVPB  Status:  Discontinued     3 g 200 mL/hr over 30  Minutes Intravenous Every 6 hours 11/20/18 2224 11/23/18 1416   11/20/18 2230  levofloxacin (LEVAQUIN) IVPB 500 mg  Status:  Discontinued     500 mg 100 mL/hr over 60 Minutes Intravenous Every 24 hours 11/20/18 2223 11/20/18 2223       Assessment/Plan Choledocholithiasis  -s/p Lap Chole with IOC, Dr. Karie SodaSteven Gross, 4/6 - s/p ERCP with biliary sphincterotomy, balloon extraction and placement of pancreatic stent, Dr. Vida RiggerMarc Magod, 4/6 - POD #1 - Intraoperative IOC cannot exclude biliary obstruction. Lipase, LFTs and bilirubin up-trending today.  - Will make NPO and await GI recommendations.  - Mobilize and IS  FEN - NPO VTE - SCD, Lovenox ID - Levaquin 4/3, Unasyn 4/3 - 4/6, WBC 9.1 Foley - None Follow Up - DOW, 4/21   LOS: 3 days    Jacinto HalimMichael M Ryanne Morand , [facility] 11/24/2018, 7:55 AM Pager: [phone]

## 2018-11-25 ENCOUNTER — Encounter (HOSPITAL_COMMUNITY)
Admission: EM | Disposition: A | Discharge status: Home / Self Care | Payer: Self-pay | Source: Home / Self Care | Attending: Internal Medicine

## 2018-11-25 ENCOUNTER — Inpatient Hospital Stay (HOSPITAL_COMMUNITY): Payer: BLUE CROSS/BLUE SHIELD

## 2018-11-25 ENCOUNTER — Inpatient Hospital Stay (HOSPITAL_COMMUNITY): Payer: BLUE CROSS/BLUE SHIELD | Admitting: Certified Registered"

## 2018-11-25 ENCOUNTER — Encounter (HOSPITAL_COMMUNITY): Payer: Self-pay | Admitting: *Deleted

## 2018-11-25 DIAGNOSIS — R110 Nausea: Secondary | ICD-10-CM

## 2018-11-25 DIAGNOSIS — K812 Acute cholecystitis with chronic cholecystitis: Secondary | ICD-10-CM

## 2018-11-25 HISTORY — PX: STENT REMOVAL: SHX6421

## 2018-11-25 HISTORY — PX: REMOVAL OF STONES: SHX5545

## 2018-11-25 HISTORY — PX: ENDOSCOPIC RETROGRADE CHOLANGIOPANCREATOGRAPHY (ERCP) WITH PROPOFOL: SHX5810

## 2018-11-25 HISTORY — PX: BILIARY DILATION: SHX6850

## 2018-11-25 HISTORY — PX: SPHINCTEROTOMY: SHX5544

## 2018-11-25 LAB — COMPREHENSIVE METABOLIC PANEL
ALT: 672 U/L — ABNORMAL HIGH (ref 0–44)
AST: 429 U/L — ABNORMAL HIGH (ref 15–41)
Albumin: 3.5 g/dL (ref 3.5–5.0)
Alkaline Phosphatase: 146 U/L — ABNORMAL HIGH (ref 38–126)
Anion gap: 10 (ref 5–15)
BUN: 7 mg/dL (ref 6–20)
CO2: 25 mmol/L (ref 22–32)
Calcium: 8.7 mg/dL — ABNORMAL LOW (ref 8.9–10.3)
Chloride: 99 mmol/L (ref 98–111)
Creatinine, Ser: 0.81 mg/dL (ref 0.61–1.24)
GFR calc Af Amer: >60 mL/min (ref >60)
GFR calc non Af Amer: >60 mL/min (ref >60)
Glucose, Bld: 97 mg/dL (ref 70–99)
Potassium: 3.5 mmol/L (ref 3.5–5.1)
Sodium: 134 mmol/L — ABNORMAL LOW (ref 135–145)
Total Bilirubin: 6.8 mg/dL — ABNORMAL HIGH (ref 0.3–1.2)
Total Protein: 6.9 g/dL (ref 6.5–8.1)

## 2018-11-25 LAB — LIPASE, BLOOD: Lipase: 74 U/L — ABNORMAL HIGH (ref 11–51)

## 2018-11-25 SURGERY — ENDOSCOPIC RETROGRADE CHOLANGIOPANCREATOGRAPHY (ERCP) WITH PROPOFOL
Anesthesia: General

## 2018-11-25 MED ORDER — EPHEDRINE SULFATE-NACL 50-0.9 MG/10ML-% IV SOSY
PREFILLED_SYRINGE | INTRAVENOUS | Status: DC | PRN
  Administered 2018-11-25: 5 mg via INTRAVENOUS

## 2018-11-25 MED ORDER — CIPROFLOXACIN IN D5W 400 MG/200ML IV SOLN
INTRAVENOUS | Status: DC | PRN
  Administered 2018-11-25: 400 mg via INTRAVENOUS

## 2018-11-25 MED ORDER — DEXAMETHASONE SODIUM PHOSPHATE 10 MG/ML IJ SOLN
INTRAMUSCULAR | Status: DC | PRN
  Administered 2018-11-25: 10 mg via INTRAVENOUS

## 2018-11-25 MED ORDER — SUCCINYLCHOLINE CHLORIDE 200 MG/10ML IV SOSY
PREFILLED_SYRINGE | INTRAVENOUS | Status: DC | PRN
  Administered 2018-11-25: 120 mg via INTRAVENOUS

## 2018-11-25 MED ORDER — GLUCAGON HCL RDNA (DIAGNOSTIC) 1 MG IJ SOLR
INTRAMUSCULAR | Status: AC
  Filled 2018-11-25: qty 1

## 2018-11-25 MED ORDER — INDOMETHACIN 50 MG RE SUPP
RECTAL | Status: AC
  Filled 2018-11-25: qty 2

## 2018-11-25 MED ORDER — PROPOFOL 10 MG/ML IV BOLUS
INTRAVENOUS | Status: DC | PRN
  Administered 2018-11-25: 200 mg via INTRAVENOUS

## 2018-11-25 MED ORDER — ROCURONIUM BROMIDE 10 MG/ML (PF) SYRINGE
PREFILLED_SYRINGE | INTRAVENOUS | Status: DC | PRN
  Administered 2018-11-25: 30 mg via INTRAVENOUS
  Administered 2018-11-25: 10 mg via INTRAVENOUS

## 2018-11-25 MED ORDER — FENTANYL CITRATE (PF) 100 MCG/2ML IJ SOLN
INTRAMUSCULAR | Status: AC
  Filled 2018-11-25: qty 2

## 2018-11-25 MED ORDER — MIDAZOLAM HCL 2 MG/2ML IJ SOLN
INTRAMUSCULAR | Status: AC
  Filled 2018-11-25: qty 2

## 2018-11-25 MED ORDER — PROPOFOL 10 MG/ML IV BOLUS
INTRAVENOUS | Status: AC
  Filled 2018-11-25: qty 20

## 2018-11-25 MED ORDER — CIPROFLOXACIN IN D5W 400 MG/200ML IV SOLN
INTRAVENOUS | Status: AC
  Filled 2018-11-25: qty 200

## 2018-11-25 MED ORDER — SUGAMMADEX SODIUM 200 MG/2ML IV SOLN
INTRAVENOUS | Status: DC | PRN
  Administered 2018-11-25: 180 mg via INTRAVENOUS

## 2018-11-25 MED ORDER — INDOMETHACIN 50 MG RE SUPP
RECTAL | Status: DC | PRN
  Administered 2018-11-25: 100 mg via RECTAL

## 2018-11-25 MED ORDER — SODIUM CHLORIDE 0.9 % IV SOLN
INTRAVENOUS | Status: DC | PRN
  Administered 2018-11-25: 12:00:00 40 mL

## 2018-11-25 MED ORDER — PHENYLEPHRINE 40 MCG/ML (10ML) SYRINGE FOR IV PUSH (FOR BLOOD PRESSURE SUPPORT)
PREFILLED_SYRINGE | INTRAVENOUS | Status: DC | PRN
  Administered 2018-11-25: 40 ug via INTRAVENOUS

## 2018-11-25 MED ORDER — FENTANYL CITRATE (PF) 250 MCG/5ML IJ SOLN
INTRAMUSCULAR | Status: DC | PRN
  Administered 2018-11-25 (×2): 50 ug via INTRAVENOUS

## 2018-11-25 MED ORDER — ALBUTEROL SULFATE (2.5 MG/3ML) 0.083% IN NEBU: 2.5000 mg | INHALATION_SOLUTION | Freq: Four times a day (QID) | RESPIRATORY_TRACT | Status: DC | PRN

## 2018-11-25 MED ORDER — ONDANSETRON HCL 4 MG/2ML IJ SOLN
INTRAMUSCULAR | Status: DC | PRN
  Administered 2018-11-25: 4 mg via INTRAVENOUS

## 2018-11-25 MED ORDER — MIDAZOLAM HCL 2 MG/2ML IJ SOLN
INTRAMUSCULAR | Status: DC | PRN
  Administered 2018-11-25: 2 mg via INTRAVENOUS

## 2018-11-25 MED ORDER — ENOXAPARIN SODIUM 40 MG/0.4ML ~~LOC~~ SOLN
40.0000 mg | SUBCUTANEOUS | Status: DC
  Administered 2018-11-25 – 2018-11-27 (×3): 40 mg via SUBCUTANEOUS
  Filled 2018-11-25 (×3): qty 0.4

## 2018-11-25 MED ORDER — LIDOCAINE 2% (20 MG/ML) 5 ML SYRINGE
INTRAMUSCULAR | Status: DC | PRN
  Administered 2018-11-25: 60 mg via INTRAVENOUS

## 2018-11-25 NOTE — Anesthesia Preprocedure Evaluation (Addendum)
Anesthesia Evaluation  Patient identified by MRN, date of birth, ID band Patient awake    Reviewed: Allergy & Precautions, NPO status , Patient's Chart, lab work & pertinent test results  History of Anesthesia Complications Negative for: history of anesthetic complications  Airway Mallampati: II  TM Distance: >3 FB Neck ROM: Full    Dental  (+) Dental Advisory Given   Pulmonary asthma ,    breath sounds clear to auscultation       Cardiovascular negative cardio ROS   Rhythm:Regular Rate:Normal     Neuro/Psych negative neurological ROS  negative psych ROS   GI/Hepatic negative GI ROS, (+)     substance abuse  marijuana use,   Endo/Other   Obesity   Renal/GU negative Renal ROS     Musculoskeletal negative musculoskeletal ROS (+)   Abdominal   Peds  Hematology negative hematology ROS (+)   Anesthesia Other Findings   Reproductive/Obstetrics                            Anesthesia Physical Anesthesia Plan  ASA: II  Anesthesia Plan: General   Post-op Pain Management:    Induction: Intravenous  PONV Risk Score and Plan: 2 and Treatment may vary due to age or medical condition, Ondansetron, Midazolam and Dexamethasone  Airway Management Planned: Oral ETT  Additional Equipment: None  Intra-op Plan:   Post-operative Plan: Extubation in OR  Informed Consent: I have reviewed the patients History and Physical, chart, labs and discussed the procedure including the risks, benefits and alternatives for the proposed anesthesia with the patient or authorized representative who has indicated his/her understanding and acceptance.     Dental advisory given  Plan Discussed with: CRNA and Anesthesiologist  Anesthesia Plan Comments:        Anesthesia Quick Evaluation

## 2018-11-25 NOTE — Progress Notes (Addendum)
2 Days Post-Op  Subjective: CC: Nausea Patient reports some abdominal "soreness" around his laparoscopic incision. Otherwise little to no abdominal pain. Was on soft diet yesterday. Had very little for lunch, became nauseated afterwards. Did not eat dinner and reports no appetite. No abdominal pain after eating. Reports nausea throughout the night, not related to pain medication administration, but no emesis. Passing flatus. No BM. Did walk in the halls yesterday. Using IS.   Objective: Vital signs in last 24 hours: Temp:  [98 F (36.7 C)-98.8 F (37.1 C)] 98.8 F (37.1 C) (04/08 0439) Pulse Rate:  [74-76] 74 (04/08 0439) Resp:  [14-18] 16 (04/08 0439) BP: (135-145)/(80-86) 140/86 (04/08 0439) SpO2:  [95 %-98 %] 98 % (04/08 0439) Last BM Date: 11/21/18  Intake/Output from previous day: 04/07 0701 - 04/08 0700 In: 480 [P.O.:480] Out: 2950 [Urine:2950] Intake/Output this shift: Total I/O In: -  Out: 300 [Urine:300]  PE: Gen: Was asleep. Easily awoken, alert, nad Eyes: Scleral icterus Heart: RRR Lungs: CTA b/l, normal effort Abd: Soft, ND, appropriatley tender around umbilicus umbilical incision. Mild tenderness of upper abdomen without r/r/g. No signs of peritonitis. Tegaderm removed. Incisions c/d/i. +BS.  Msk: No edema   Lab Results:  Recent Labs    11/23/18 2033 11/24/18 0517  WBC 7.4 9.1  HGB 13.9 13.3  HCT 43.5 42.2  PLT 180 175   BMET Recent Labs    11/24/18 0517 11/25/18 0557  NA 135 134*  K 3.6 3.5  CL 100 99  CO2 24 25  GLUCOSE 101* 97  BUN 8 7  CREATININE 0.74 0.81  CALCIUM 8.7* 8.7*   PT/INR No results for input(s): LABPROT, INR in the last 72 hours. CMP     Component Value Date/Time   NA 134 (L) 11/25/2018 0557   K 3.5 11/25/2018 0557   CL 99 11/25/2018 0557   CO2 25 11/25/2018 0557   GLUCOSE 97 11/25/2018 0557   BUN 7 11/25/2018 0557   CREATININE 0.81 11/25/2018 0557   CALCIUM 8.7 (L) 11/25/2018 0557   PROT 6.9 11/25/2018 0557   ALBUMIN 3.5 11/25/2018 0557   AST 429 (H) 11/25/2018 0557   ALT 672 (H) 11/25/2018 0557   ALKPHOS 146 (H) 11/25/2018 0557   BILITOT 6.8 (H) 11/25/2018 0557   GFRNONAA >60 11/25/2018 0557   GFRAA >60 11/25/2018 0557   Lipase     Component Value Date/Time   LIPASE 74 (H) 11/25/2018 0557       Studies/Results: Dg Cholangiogram Operative  Result Date: 11/23/2018 CLINICAL DATA:  Cholelithiasis EXAM: INTRAOPERATIVE CHOLANGIOGRAM TECHNIQUE: Cholangiographic images from the C-arm fluoroscopic device were submitted for interpretation post-operatively. Please see the procedural report for the amount of contrast and the fluoroscopy time utilized. COMPARISON:  None. FINDINGS: Contrast fills the biliary tree without filling defects in the common bile duct. Contrast does not reach the duodenum. IMPRESSION: Contrast fills the biliary tree but not the duodenum. Biliary obstruction is not excluded. Electronically Signed   By: Jolaine Click M.D.   On: 11/23/2018 11:58   Dg Ercp Biliary & Pancreatic Ducts  Result Date: 11/23/2018 CLINICAL DATA:  Gallstones EXAM: ERCP TECHNIQUE: Multiple spot images obtained with the fluoroscopic device and submitted for interpretation post-procedure. FLUOROSCOPY TIME:  Fluoroscopy Time:  11 minutes and 41 seconds. Radiation Exposure Index (if provided by the fluoroscopic device): Number of Acquired Spot Images: 0 COMPARISON:  None. FINDINGS: Images demonstrate cannulation of the common bile duct and contrast filling the biliary tree. IMPRESSION: See  above. These images were submitted for radiologic interpretation only. Please see the procedural report for the amount of contrast and the fluoroscopy time utilized. Electronically Signed   By: Jolaine ClickArthur  Hoss M.D.   On: 11/23/2018 11:05    Anti-infectives: Anti-infectives (From admission, onward)   Start     Dose/Rate Route Frequency Ordered Stop   11/20/18 2245  Ampicillin-Sulbactam (UNASYN) 3 g in sodium chloride 0.9 % 100 mL  IVPB  Status:  Discontinued     3 g 200 mL/hr over 30 Minutes Intravenous Every 6 hours 11/20/18 2224 11/23/18 1416   11/20/18 2230  levofloxacin (LEVAQUIN) IVPB 500 mg  Status:  Discontinued     500 mg 100 mL/hr over 60 Minutes Intravenous Every 24 hours 11/20/18 2223 11/20/18 2223       Assessment/Plan Choledocholithiasis  - s/p Lap Chole with IOC, Dr. Karie SodaSteven Gross, 4/6 - s/p ERCP with biliary sphincterotomy, balloon extraction and placement of pancreatic stent, Dr. Vida RiggerMarc Magod, 4/6 - POD #2 - Intraoperative IOC cannot exclude biliary obstruction. LFTs and bilirubin continue to up-trend today. - Concern for continued CBD obstruction, will discuss with GI and await further recommendations. May need CBD stenting.  - Mobilize and IS - Additional labs pending   FEN - NPO, IVF (LR) VTE - SCD, Lovenox ID - Levaquin 4/3, Unasyn 4/3 - 4/6, WBC 9.1 (4/7) Foley - None Follow Up - DOW, 4/21  Plan: Leave NPO. Will discuss with GI about patients continued uptrending LFTs and Bilirubin. Patient may benefit from CBD stenting. Follow up on additional labs (CBC etc). Mobilize and IS.    LOS: 4 days    Jacinto HalimMichael M Hobie Kohles , [facility] 11/25/2018, 7:52 AM Pager: [phone]

## 2018-11-25 NOTE — Anesthesia Procedure Notes (Signed)
Procedure Name: Intubation Date/Time: 11/25/2018 11:27 AM Performed by: Eben Burow, CRNA Pre-anesthesia Checklist: Patient identified, Emergency Drugs available, Suction available, Patient being monitored and Timeout performed Patient Re-evaluated:Patient Re-evaluated prior to induction Oxygen Delivery Method: Circle system utilized Preoxygenation: Pre-oxygenation with 100% oxygen Induction Type: IV induction and Rapid sequence Laryngoscope Size: Mac and 4 Grade View: Grade I Tube type: Oral Tube size: 7.5 mm Number of attempts: 1 Airway Equipment and Method: Stylet Placement Confirmation: ETT inserted through vocal cords under direct vision,  positive ETCO2 and breath sounds checked- equal and bilateral Secured at: 23 cm Tube secured with: Tape Dental Injury: Teeth and Oropharynx as per pre-operative assessment

## 2018-11-25 NOTE — Transfer of Care (Signed)
Immediate Anesthesia Transfer of Care Note  Patient: Alejandro Willis  Procedure(s) Performed: ENDOSCOPIC RETROGRADE CHOLANGIOPANCREATOGRAPHY (ERCP) WITH PROPOFOL (N/A ) SPHINCTEROTOMY STENT REMOVAL  Patient Location: PACU and Endoscopy Unit  Anesthesia Type:General  Level of Consciousness: awake and drowsy  Airway & Oxygen Therapy: Patient Spontanous Breathing and Patient connected to face mask oxygen  Post-op Assessment: Report given to RN and Post -op Vital signs reviewed and stable  Post vital signs: Reviewed and stable  Last Vitals:  Vitals Value Taken Time  BP 146/86 11/25/2018 12:30 PM  Temp 36.8 C 11/25/2018 12:27 PM  Pulse 75 11/25/2018 12:34 PM  Resp 18 11/25/2018 12:34 PM  SpO2 100 % 11/25/2018 12:34 PM  Vitals shown include unvalidated device data.  Last Pain:  Vitals:   11/25/18 1227  TempSrc: Oral  PainSc: 0-No pain      Patients Stated Pain Goal: 4 (11/23/18 0729)  Complications: No apparent anesthesia complications

## 2018-11-25 NOTE — Progress Notes (Signed)
Alejandro Willis 11:09 AM  Subjective: Patient discussed with surgical team yesterday and my partner Dr. Randa Evens and discussed on the phone with the patient this morning and labs reviewed and patient seen and examined in endoscopy unit and he has no new complaints  Objective: Vital signs stable afebrile no acute distress exam please see preassessment evaluation LFTs increased IOC reviewed  Assessment: Questionable residual stones versus stricture versus edema from previous procedure  Plan: I discussed with the patient repeat procedure and he is in agreement and will proceed this morning with anesthesia assistance  [facility] E  Pager [phone] After 5PM or if no answer call [phone]

## 2018-11-25 NOTE — Anesthesia Postprocedure Evaluation (Signed)
Anesthesia Post Note  Patient: Alejandro Willis  Procedure(s) Performed: ENDOSCOPIC RETROGRADE CHOLANGIOPANCREATOGRAPHY (ERCP) WITH PROPOFOL (N/A ) SPHINCTEROTOMY STENT REMOVAL BILIARY DILATION     Patient location during evaluation: PACU Anesthesia Type: General Level of consciousness: awake and alert Pain management: pain level controlled Vital Signs Assessment: post-procedure vital signs reviewed and stable Respiratory status: spontaneous breathing, nonlabored ventilation and respiratory function stable Cardiovascular status: blood pressure returned to baseline and stable Postop Assessment: no apparent nausea or vomiting Anesthetic complications: no    Last Vitals:  Vitals:   11/25/18 1250 11/25/18 1255  BP: (!) 154/87   Pulse: 84 91  Resp: (!) 22 (!) 26  Temp:    SpO2: 97% 98%    Last Pain:  Vitals:   11/25/18 1255  TempSrc:   PainSc: 0-No pain                 Beryle Lathe

## 2018-11-25 NOTE — Op Note (Signed)
[facility] Patient Name: Alejandro Willis Procedure Date: 11/25/2018 MRN: [id number] Attending MD: Vida Rigger , MD Date of Birth: 16-Jun-1976 CSN: [id number] Age: 43 Admit Type: Inpatient Procedure:                ERCP Indications:              Common bile duct stone(s) on Intra-Op cholangiogram                            pain no better increasing liver tests Providers:                Vida Rigger, MD, Dwain Sarna, RN, Arlee Muslim                            Tech., Technician, Brion Aliment, Technician,                            Evelina Dun, CRNA Referring MD:              Medicines:                General Anesthesia Complications:            No immediate complications. Estimated Blood Loss:     Estimated blood loss: none. Procedure:                Pre-Anesthesia Assessment:                           - Prior to the procedure, a History and Physical                            was performed, and patient medications and                            allergies were reviewed. The patient's tolerance of                            previous anesthesia was also reviewed. The risks                            and benefits of the procedure and the sedation                            options and risks were discussed with the patient.                            All questions were answered, and informed consent                            was obtained. Prior Anticoagulants: The patient has                            taken no previous anticoagulant or antiplatelet                            agents. ASA  Grade Assessment: I - A normal, healthy                            patient. After reviewing the risks and benefits,                            the patient was deemed in satisfactory condition to                            undergo the procedure.                           After obtaining informed consent, the scope was                            passed under direct vision. Throughout  the                            procedure, the patient's blood pressure, pulse, and                            oxygen saturations were monitored continuously. The                            TJF-Q180V ([id number]) Olympus duodenoscope was                            introduced through the mouth, and used to inject                            contrast into and used to cannulate the bile duct.                            The ERCP was accomplished without difficulty. The                            patient tolerated the procedure well. Scope In: Scope Out: Findings:      One pancreatic stent originating in the pancreatic duct was emerging       from the major papilla. A biliary sphincterotomy had been performed. The       sphincterotomy appeared open. The major papilla was edematous. Deep       selective CBD cannulation was readily obtained on the first attempt and       there was no pancreatic duct injection or wire advancement throughout       the procedure and the CBD was filled with dye and there did appear to be       a distal stone and no obvious biliary drainage so we initially increased       the biliary sphincterotomy which was extended with a Hydratome       sphincterotome using ERBE electrocautery. There was no       post-sphincterotomy bleeding. The biliary tree was swept with an       adjustable 9- 12 mm balloon with injection below the balloon starting at  the bifurcation. All stones were removed at the end of the procedure       however despite multiple pull-through's using the 9 mm balloon there       still appeared to be sluggish drainage and residual stone so we       proceeded with Dilation of the common bile duct with a 6 mm by 4 cm       balloon dilator which was successful. The biliary tree was swept with       the 12 mm balloon starting at the bifurcation. All stones were removed.       Nothing was found after multiple much easier balloon pull-through's. And       there  was adequate biliary drainage and no obvious residual stone seen       at the end of the procedure and we elected to remove one stent which was       removed from the pancreatic duct using a regular forceps. Impression:               - One stent from the pancreatic duct was seen in                            the major papilla. And removed with the biopsy                            forceps at the end of the procedure                           - Prior biliary sphincterotomy appeared open.                           - The major papilla appeared edematous.                           - Choledocholithiasis was found. Complete removal                            was accomplished by biliary sphincterotomy and                            balloon extraction.                           - A biliary sphincterotomy was performed.                           - The biliary tree was swept.                           - Common bile duct was successfully dilated.                           - The biliary tree was swept and nothing was found                            at the end of the procedure. Moderate Sedation:      Not Applicable - Patient  had care per Anesthesia. Recommendation:           - Clear liquid diet for 6 hours. And if doing well                            may slowly advance                           - Continue present medications.                           - Return to GI clinic PRN.                           - Telephone GI clinic if symptomatic PRN.                           - Check liver enzymes (AST, ALT, alkaline                            phosphatase, bilirubin) tomorrow. If feeling better                            and tolerating diet possibly home tomorrow and                            follow-up liver tests as an outpatient Procedure Code(s):        --- Professional ---                           [phone], 59, Endoscopic retrograde                            cholangiopancreatography (ERCP); with                             trans-endoscopic balloon dilation of                            biliary/pancreatic duct(s) or of ampulla                            (sphincteroplasty), including sphincterotomy, when                            performed, each duct                           [id number], Endoscopic retrograde                            cholangiopancreatography (ERCP); with removal of                            calculi/debris from biliary/pancreatic duct(s) Diagnosis Code(s):        --- Professional ---  Z96.89, Presence of other specified functional                            implants                           K80.50, Calculus of bile duct without cholangitis                            or cholecystitis without obstruction                           K83.8, Other specified diseases of biliary tract CPT copyright 2019 American Medical Association. All rights reserved. The codes documented in this report are preliminary and upon coder review may  be revised to meet current compliance requirements. Vida RiggerMarc Jakeria Caissie, MD 11/25/2018 12:29:02 PM This report has been signed electronically. Number of Addenda: 0

## 2018-11-25 NOTE — Progress Notes (Signed)
PROGRESS NOTE  Alejandro Willis NFA:[id number] DOB: 24-Sep-1975 DOA: 11/20/2018 PCP: Patient, No Pcp Per   LOS: 4 days   Brief narrative: Patientis a 42 y.o.male with no past medical historywhopresentedto the ED with hx ofabdominal pain and wasfound to have cholecystitis and choledocholithiasis. UnderwentERCP and cholecystectomy on 11/24/2018.  Subjective: Still complains of mild right upper quadrant pain.  Some mild nausea but no vomiting.  No fever or chills.  Had a bowel movement yet.  Status post laparoscopic cholecystectomy  Assessment/Plan:  Principal Problem:   Elevated LFTs Active Problems:   Choledocholithiasis   Transaminitis   Acute on chronic cholecystitis s/p lap cholecystectomy 11/23/2018   Nausea without vomiting  Acute choledocholithiasis GI and general surgery on board.  Status post ERCP with removal of CBD stones and stent placement on 11/23/2018 but with trending of LFTs.  Patient might need repeat endoscopic evaluation to remove further stones.  Status post laparoscopic cholecystectomy with intraoperative cholangiogram on 11/23/2018 by general surgery.  Lipase trending down from 3 83 to 74 today.  Elevated AST/ALT -Mildly elevated on admission.  Post ERCP, liver enzymes are trending up.  Will likely need repeat ERCP and probable stenting today.  Marijuana use Patient has been counseled about it.  GERD/ Hiatal hernia -Continue Pepcid   VTE Prophylaxis: Lovenox  Code Status: Full code  Family Communication: No one is available at bedside  Disposition Plan: Home   Consultants:  GI, general surgery  Procedures:  Cholecystectomy with intraoperative cholangiogram.  ERCP with CBD stent removal and stenting  Antibiotics: Anti-infectives (From admission, onward)   Start     Dose/Rate Route Frequency Ordered Stop   11/20/18 2245  Ampicillin-Sulbactam (UNASYN) 3 g in sodium chloride 0.9 % 100 mL IVPB  Status:  Discontinued     3 g 200 mL/hr  over 30 Minutes Intravenous Every 6 hours 11/20/18 2224 11/23/18 1416   11/20/18 2230  levofloxacin (LEVAQUIN) IVPB 500 mg  Status:  Discontinued     500 mg 100 mL/hr over 60 Minutes Intravenous Every 24 hours 11/20/18 2223 11/20/18 2223       Objective: Vitals:   11/24/18 2114 11/25/18 0439  BP: (!) 145/80 140/86  Pulse: 75 74  Resp: 14 16  Temp: 98.3 F (36.8 C) 98.8 F (37.1 C)  SpO2: 95% 98%    Intake/Output Summary (Last 24 hours) at 11/25/2018 1009 Last data filed at 11/25/2018 0738 Gross per 24 hour  Intake 240 ml  Output 3250 ml  Net -3010 ml   Filed Weights   11/20/18 0825 11/23/18 0729  Weight: 90.7 kg 90.7 kg   Body mass index is 32.28 kg/m.   Physical Exam: GENERAL: Patient is alert awake and oriented. Not in obvious distress.  Lying on the side of the bed. HENT: No scleral pallor or icterus. Pupils equally reactive to light. Oral mucosa is moist NECK: is supple, no palpable thyroid enlargement. CHEST: Clear to auscultation. No crackles or wheezes. Non tender on palpation. Diminished breath sounds bilaterally. CVS: S1 and S2 heard, no murmur. Regular rate and rhythm. No pericardial rub. ABDOMEN: Soft, mild right upper quadrant tenderness, dry and intact laparoscopic scar.  Bowel sounds are present.  No guarding or rigidity noted EXTREMITIES: No edema. CNS: Cranial nerves are intact. No focal motor or sensory deficits. SKIN: warm and dry without rashes.  Data Review: I have personally reviewed the following laboratory data and studies,  CBC: Recent Labs  Lab 11/20/18 0910 11/22/18 0529 11/23/18 2033 11/24/18 0517  WBC  5.7 4.6 7.4 9.1  NEUTROABS  --   --  6.2  --   HGB 15.4 13.6 13.9 13.3  HCT 49.8 44.2 43.5 42.2  MCV 78.9* 80.5 78.8* 78.9*  PLT 205 193 180 175   Basic Metabolic Panel: Recent Labs  Lab 11/21/18 0535 11/22/18 0529 11/23/18 0437 11/24/18 0517 11/25/18 0557  NA 138 138 137 135 134*  K 3.9 3.6 3.6 3.6 3.5  CL 105 103 103 100 99   CO2 25 27 25 24 25   GLUCOSE 97 94 86 101* 97  BUN <5* <5* 6 8 7   CREATININE 0.76 0.89 0.94 0.74 0.81  CALCIUM 8.7* 8.8* 8.9 8.7* 8.7*  MG  --   --   --  2.0  --    Liver Function Tests: Recent Labs  Lab 11/21/18 0535 11/22/18 0529 11/23/18 0437 11/24/18 0517 11/25/18 0557  AST 206* 149* 162* 306* 429*  ALT 534* 440* 393* 523* 672*  ALKPHOS 110 121 118 129* 146*  BILITOT 4.3* 4.4* 4.9* 5.8* 6.8*  PROT 6.8 7.0 7.2 6.8 6.9  ALBUMIN 3.5 3.7 3.7 3.4* 3.5   Recent Labs  Lab 11/20/18 0910 11/24/18 0517 11/25/18 0557  LIPASE 25 383* 74*   No results for input(s): AMMONIA in the last 168 hours. Cardiac Enzymes: No results for input(s): CKTOTAL, CKMB, CKMBINDEX, TROPONINI in the last 168 hours. BNP (last 3 results) No results for input(s): BNP in the last 8760 hours.  ProBNP (last 3 results) No results for input(s): PROBNP in the last 8760 hours.  CBG: No results for input(s): GLUCAP in the last 168 hours. Recent Results (from the past 240 hour(s))  Surgical pcr screen     Status: None   Collection Time: 11/23/18  6:43 AM  Result Value Ref Range Status   MRSA, PCR NEGATIVE NEGATIVE Final   Staphylococcus aureus NEGATIVE NEGATIVE Final    Comment: (NOTE) The Xpert SA Assay (FDA approved for NASAL specimens in patients 42 years of age and older), is one component of a comprehensive surveillance program. It is not intended to diagnose infection nor to guide or monitor treatment. Performed at [facility], [address] [place], [place] [address]      Studies: Dg Cholangiogram Operative  Result Date: 11/23/2018 CLINICAL DATA:  Cholelithiasis EXAM: INTRAOPERATIVE CHOLANGIOGRAM TECHNIQUE: Cholangiographic images from the C-arm fluoroscopic device were submitted for interpretation post-operatively. Please see the procedural report for the amount of contrast and the fluoroscopy time utilized. COMPARISON:  None. FINDINGS: Contrast fills the biliary tree  without filling defects in the common bile duct. Contrast does not reach the duodenum. IMPRESSION: Contrast fills the biliary tree but not the duodenum. Biliary obstruction is not excluded. Electronically Signed   By: Jolaine ClickArthur  Hoss M.D.   On: 11/23/2018 11:58   Dg Ercp Biliary & Pancreatic Ducts  Result Date: 11/23/2018 CLINICAL DATA:  Gallstones EXAM: ERCP TECHNIQUE: Multiple spot images obtained with the fluoroscopic device and submitted for interpretation post-procedure. FLUOROSCOPY TIME:  Fluoroscopy Time:  11 minutes and 41 seconds. Radiation Exposure Index (if provided by the fluoroscopic device): Number of Acquired Spot Images: 0 COMPARISON:  None. FINDINGS: Images demonstrate cannulation of the common bile duct and contrast filling the biliary tree. IMPRESSION: See above. These images were submitted for radiologic interpretation only. Please see the procedural report for the amount of contrast and the fluoroscopy time utilized. Electronically Signed   By: Jolaine ClickArthur  Hoss M.D.   On: 11/23/2018 11:05    Scheduled Meds: .  acetaminophen  1,000 mg Oral Q6H  . enoxaparin (LOVENOX) injection  40 mg Subcutaneous Q24H  . gabapentin  300 mg Oral BID  . ibuprofen  600 mg Oral TID  . lip balm  1 application Topical BID  . loratadine  10 mg Oral Daily  . pantoprazole (PROTONIX) IV  40 mg Intravenous Q24H  . psyllium  1 packet Oral Daily    Continuous Infusions: . famotidine (PEPCID) IV 20 mg (11/24/18 2123)  . lactated ringers    . lactated ringers 125 mL/hr at 11/24/18 1313  . ondansetron (ZOFRAN) IV       Joycelyn Das, MD  [facility] 11/25/2018

## 2018-11-26 ENCOUNTER — Encounter (HOSPITAL_COMMUNITY): Payer: Self-pay | Admitting: Gastroenterology

## 2018-11-26 LAB — CBC WITH DIFFERENTIAL/PLATELET
Abs Immature Granulocytes: 0.03 10*3/uL (ref 0.00–0.07)
Basophils Absolute: 0 10*3/uL (ref 0.0–0.1)
Basophils Relative: 0 %
Eosinophils Absolute: 0 10*3/uL (ref 0.0–0.5)
Eosinophils Relative: 0 %
HCT: 40.3 % (ref 39.0–52.0)
Hemoglobin: 12.7 g/dL — ABNORMAL LOW (ref 13.0–17.0)
Immature Granulocytes: 0 %
Lymphocytes Relative: 21 %
Lymphs Abs: 1.6 10*3/uL (ref 0.7–4.0)
MCH: 24.8 pg — ABNORMAL LOW (ref 26.0–34.0)
MCHC: 31.5 g/dL (ref 30.0–36.0)
MCV: 78.7 fL — ABNORMAL LOW (ref 80.0–100.0)
Monocytes Absolute: 0.8 10*3/uL (ref 0.1–1.0)
Monocytes Relative: 11 %
Neutro Abs: 5.1 10*3/uL (ref 1.7–7.7)
Neutrophils Relative %: 68 %
Platelets: 166 10*3/uL (ref 150–400)
RBC: 5.12 MIL/uL (ref 4.22–5.81)
RDW: 15.3 % (ref 11.5–15.5)
WBC: 7.5 10*3/uL (ref 4.0–10.5)
nRBC: 0 % (ref 0.0–0.2)

## 2018-11-26 LAB — COMPREHENSIVE METABOLIC PANEL
ALT: 787 U/L — ABNORMAL HIGH (ref 0–44)
AST: 534 U/L — ABNORMAL HIGH (ref 15–41)
Albumin: 3.4 g/dL — ABNORMAL LOW (ref 3.5–5.0)
Alkaline Phosphatase: 186 U/L — ABNORMAL HIGH (ref 38–126)
Anion gap: 10 (ref 5–15)
BUN: 9 mg/dL (ref 6–20)
CO2: 26 mmol/L (ref 22–32)
Calcium: 8.8 mg/dL — ABNORMAL LOW (ref 8.9–10.3)
Chloride: 97 mmol/L — ABNORMAL LOW (ref 98–111)
Creatinine, Ser: 0.84 mg/dL (ref 0.61–1.24)
GFR calc Af Amer: >60 mL/min (ref >60)
GFR calc non Af Amer: >60 mL/min (ref >60)
Glucose, Bld: 104 mg/dL — ABNORMAL HIGH (ref 70–99)
Potassium: 3.7 mmol/L (ref 3.5–5.1)
Sodium: 133 mmol/L — ABNORMAL LOW (ref 135–145)
Total Bilirubin: 3.7 mg/dL — ABNORMAL HIGH (ref 0.3–1.2)
Total Protein: 7.1 g/dL (ref 6.5–8.1)

## 2018-11-26 LAB — LIPASE, BLOOD: Lipase: 42 U/L (ref 11–51)

## 2018-11-26 LAB — MAGNESIUM: Magnesium: 2.2 mg/dL (ref 1.7–2.4)

## 2018-11-26 NOTE — Progress Notes (Signed)
1 Day Post-Op  Subjective: CC: hungry  Feels well. No pain. Walking. Eager for breakfast. ERCP yesterday successfully cleared CBD  Objective: Vital signs in last 24 hours: Temp:  [97.6 F (36.4 C)-98.9 F (37.2 C)] 97.6 F (36.4 C) (04/09 0511) Pulse Rate:  [70-91] 70 (04/09 0511) Resp:  [16-26] 16 (04/09 0511) BP: (133-154)/(73-93) 135/82 (04/09 0511) SpO2:  [95 %-100 %] 99 % (04/09 0511) Weight:  [90.7 kg] 90.7 kg (04/08 1023) Last BM Date: 11/26/18  Intake/Output from previous day: 04/08 0701 - 04/09 0700 In: 1600 [P.O.:480; I.V.:920; IV Piggyback:200] Out: 1151 [Urine:1151] Intake/Output this shift: No intake/output data recorded.  PE: Gen: alert and well appearing Eyes: minimal scleral icterus Heart: RRR Lungs: CTA b/l, normal effort Abd: Soft, ND, appropriatley tender around umbilical incision. Incisions c/d/i. Msk: No edema   Lab Results:  Recent Labs    11/24/18 0517 11/26/18 0505  WBC 9.1 7.5  HGB 13.3 12.7*  HCT 42.2 40.3  PLT 175 166   BMET Recent Labs    11/25/18 0557 11/26/18 0505  NA 134* 133*  K 3.5 3.7  CL 99 97*  CO2 25 26  GLUCOSE 97 104*  BUN 7 9  CREATININE 0.81 0.84  CALCIUM 8.7* 8.8*   PT/INR No results for input(s): LABPROT, INR in the last 72 hours. CMP     Component Value Date/Time   NA 133 (L) 11/26/2018 0505   K 3.7 11/26/2018 0505   CL 97 (L) 11/26/2018 0505   CO2 26 11/26/2018 0505   GLUCOSE 104 (H) 11/26/2018 0505   BUN 9 11/26/2018 0505   CREATININE 0.84 11/26/2018 0505   CALCIUM 8.8 (L) 11/26/2018 0505   PROT 7.1 11/26/2018 0505   ALBUMIN 3.4 (L) 11/26/2018 0505   AST 534 (H) 11/26/2018 0505   ALT 787 (H) 11/26/2018 0505   ALKPHOS 186 (H) 11/26/2018 0505   BILITOT 3.7 (H) 11/26/2018 0505   GFRNONAA >60 11/26/2018 0505   GFRAA >60 11/26/2018 0505   Lipase     Component Value Date/Time   LIPASE 42 11/26/2018 0505       Studies/Results: Dg Ercp  Result Date: 11/25/2018 CLINICAL DATA:   43 year old male with a history of choledocholithiasis EXAM: ERCP TECHNIQUE: Multiple spot images obtained with the fluoroscopic device and submitted for interpretation post-procedure. FLUOROSCOPY TIME:  Fluoroscopy Time:  5 minutes 5 seconds COMPARISON:  None. FINDINGS: Limited images during ERCP. Initial image demonstrates endoscope projecting over the upper abdomen with a safety wire in place and partial opacification of the extrahepatic biliary ducts. Vague filling defects are present, potentially gas and/or debris. IMPRESSION: Limited images during ERCP demonstrates partial opacification of the extrahepatic biliary ducts, with small vague filling defects, either air or debris/stones. Please refer to the dictated operative report for full details of intraoperative findings and procedure. Electronically Signed   By: Corrie Mckusick D.O.   On: 11/25/2018 12:42    Anti-infectives: Anti-infectives (From admission, onward)   Start     Dose/Rate Route Frequency Ordered Stop   11/20/18 2245  Ampicillin-Sulbactam (UNASYN) 3 g in sodium chloride 0.9 % 100 mL IVPB  Status:  Discontinued     3 g 200 mL/hr over 30 Minutes Intravenous Every 6 hours 11/20/18 2224 11/23/18 1416   11/20/18 2230  levofloxacin (LEVAQUIN) IVPB 500 mg  Status:  Discontinued     500 mg 100 mL/hr over 60 Minutes Intravenous Every 24 hours 11/20/18 2223 11/20/18 2223       Assessment/Plan  Choledocholithiasis  - s/p Lap Chole with IOC, Dr. Michael Boston, 4/6 - s/p ERCP with biliary sphincterotomy, balloon extraction and placement of pancreatic stent, Dr. Clarene Essex, 4/6 and again 4/8 with successful clearance of CBD  FEN - soft diet VTE - SCD, Lovenox ID - Levaquin 4/3, Unasyn 4/3 - 4/6, WBC 7.5 Foley - None Follow Up - DOW, 4/21  Plan: Recovering well from surgery standpoint. Mobilize, ADAT, discharge planning. Tbili down but  Enzymes/ alk phos slightly up, likely reactive. Would recommend rechecking tomorrow to ensure  downtrending prior to DC. Surgery will follow peripherally- please call with questions or concerns any time.     LOS: 5 days    Alejandro Willis , [facility] 11/26/2018, 7:58 AM

## 2018-11-26 NOTE — Progress Notes (Signed)
[facility] PROGRESS NOTE Subjective Patient had another ERCP by Dr. Ewing Schlein yesterday with some additional stone material removed.  He states that he did not feel much better after the first ERCP but feels much better this morning.  He is ordered breakfast and feels hungry  Objective: Vital signs in last 24 hours: Temp:  [97.6 F (36.4 C)-98.9 F (37.2 C)] 97.6 F (36.4 C) (04/09 0511) Pulse Rate:  [70-91] 70 (04/09 0511) Resp:  [16-26] 16 (04/09 0511) BP: (133-154)/(73-93) 135/82 (04/09 0511) SpO2:  [95 %-100 %] 99 % (04/09 0511) Weight:  [90.7 kg] 90.7 kg (04/08 1023) Last BM Date: 11/26/18  Intake/Output from previous day: 04/08 0701 - 04/09 0700 In: 1600 [P.O.:480; I.V.:920; IV Piggyback:200] Out: 1151 [Urine:1151] Intake/Output this shift: No intake/output data recorded.  PE: General--alert walking around the room in no distress  Abdomen--slightly distended bowel sounds present minimal tenderness  Lab Results: Recent Labs    11/23/18 2033 11/24/18 0517 11/26/18 0505  WBC 7.4 9.1 7.5  HGB 13.9 13.3 12.7*  HCT 43.5 42.2 40.3  PLT 180 175 166   BMET Recent Labs    11/24/18 0517 11/25/18 0557 11/26/18 0505  NA 135 134* 133*  K 3.6 3.5 3.7  CL 100 99 97*  CO2 24 25 26   CREATININE 0.74 0.81 0.84   LFT Recent Labs    11/24/18 0517 11/25/18 0557 11/26/18 0505  PROT 6.8 6.9 7.1  AST 306* 429* 534*  ALT 523* 672* 787*  ALKPHOS 129* 146* 186*  BILITOT 5.8* 6.8* 3.7*   PT/INR No results for input(s): LABPROT, INR in the last 72 hours. PANCREAS Recent Labs    11/24/18 0517 11/25/18 0557 11/26/18 0505  LIPASE 383* 74* 42         Studies/Results: Dg Ercp  Result Date: 11/25/2018 CLINICAL DATA:  43 year old male with a history of choledocholithiasis EXAM: ERCP TECHNIQUE: Multiple spot images obtained with the fluoroscopic device and submitted for interpretation post-procedure. FLUOROSCOPY TIME:  Fluoroscopy Time:  5 minutes 5 seconds  COMPARISON:  None. FINDINGS: Limited images during ERCP. Initial image demonstrates endoscope projecting over the upper abdomen with a safety wire in place and partial opacification of the extrahepatic biliary ducts. Vague filling defects are present, potentially gas and/or debris. IMPRESSION: Limited images during ERCP demonstrates partial opacification of the extrahepatic biliary ducts, with small vague filling defects, either air or debris/stones. Please refer to the dictated operative report for full details of intraoperative findings and procedure. Electronically Signed   By: Gilmer Mor D.O.   On: 11/25/2018 12:42    Medications: I have reviewed the patient's current medications.  Assessment:   1.  CBD stones.  Patient's bilirubin is down somewhat this morning all other LFTs are actually slightly elevated.  He clinically feels much better, less tender etc.  Long discussion with him about sphincterotomy stone extractions and the fact that it may take several days for the edema to completely resolve for the LFTs to come back completely to normal.  He clearly is better after the ERCP yesterday   Plan: Agree with going ahead and advancing diet.  If he has any further problems please call Dr Ewing Schlein back.  Hopefully this will resolve things.  We will sign off, please call us back as needed  Tresea Mall 11/26/2018, 8:35 AM  This note was created using voice recognition software. Minor errors may Have occurred unintentionally.  Pager: [phone] If no answer or after hours call [phone]

## 2018-11-26 NOTE — Progress Notes (Signed)
PROGRESS NOTE  Oneal Abdus-Salaam ZOX:[id number] DOB: [birth year] DOA: 11/20/2018 PCP: Patient, No Pcp Per   LOS: 5 days   Brief narrative: Patientis a 43 y.o.male with no past medical historywhopresentedto the ED with hx ofabdominal pain and wasfound to have cholecystitis and choledocholithiasis. UnderwentERCP and cholecystectomy on 11/24/2018. patient underwent a repeat ERCP on 11/25/2018  Subjective: Patient complains of feeling much better today.  No nausea vomiting or abdominal pain.  Has had a bowel movement yesterday.  No fever or chills reported.  Assessment/Plan:  Principal Problem:   Elevated LFTs Active Problems:   Choledocholithiasis   Transaminitis   Acute on chronic cholecystitis s/p lap cholecystectomy 11/23/2018   Nausea without vomiting  Acute choledocholithiasis GI and general surgery on board.  Status post ERCP with removal of CBD stones and stent placement on 11/23/2018 but with trending of LFTs. Status post laparoscopic cholecystectomy with intraoperative cholangiogram on 11/23/2018 by general surgery.  Patient underwent ERCP with sphincterectomy repeat on 11/25/18 due to rising LFTs.  CBD stones have been clear at this time.  Bilirubin has decreased but AST ALT still high.  Will trend.  Elevated AST/ALT Post ERCP x2, LFTs are still elevated.  GI on board and has recommended advancing on the diet.  Patient has clinically improved.  LFTs may take some time to improve.  Will repeat LFTs again in a.m.  Marijuana use Patient has been counseled about it.  GERD/ Hiatal hernia -Continue Protonix   VTE Prophylaxis: Lovenox  Code Status: Full code  Family Communication: No one is available at bedside.  Disposition Plan: Home likely in 1 to 2 days.   Consultants:  GI, general surgery  Procedures:  Cholecystectomy with intraoperative cholangiogram.  ERCP with CBD stent removal and stenting, 11/23/2018, 11/25/2018  Antibiotics: Anti-infectives (From  admission, onward)   Start     Dose/Rate Route Frequency Ordered Stop   11/20/18 2245  Ampicillin-Sulbactam (UNASYN) 3 g in sodium chloride 0.9 % 100 mL IVPB  Status:  Discontinued     3 g 200 mL/hr over 30 Minutes Intravenous Every 6 hours 11/20/18 2224 11/23/18 1416   11/20/18 2230  levofloxacin (LEVAQUIN) IVPB 500 mg  Status:  Discontinued     500 mg 100 mL/hr over 60 Minutes Intravenous Every 24 hours 11/20/18 2223 11/20/18 2223     Objective: Vitals:   11/25/18 2014 11/26/18 0511  BP: 137/86 135/82  Pulse: 74 70  Resp: 18 16  Temp: 98.2 F (36.8 C) 97.6 F (36.4 C)  SpO2: 97% 99%    Intake/Output Summary (Last 24 hours) at 11/26/2018 1040 Last data filed at 11/26/2018 0441 Gross per 24 hour  Intake 1600 ml  Output 851 ml  Net 749 ml   Filed Weights   11/20/18 0825 11/23/18 0729 11/25/18 1023  Weight: 90.7 kg 90.7 kg 90.7 kg   Body mass index is 32.27 kg/m.   Physical Exam:  General: Obese, alert awake communicative.  Not in obvious distress HENT: Normocephalic, pupils equally reacting to light and accommodation.  No scleral pallor or icterus noted. Oral mucosa is moist.  Chest:  Clear breath sounds.  Diminished breath sounds bilaterally. No crackles or wheezes.  CVS: S1 &S2 heard. No murmur.  Regular rate and rhythm. Abdomen: Soft, nontender, nondistended.  Bowel sounds are heard.  Liver is not palpable, no abdominal mass palpated.  Laparoscopic scar noted. Extremities: No cyanosis, clubbing or edema.  Peripheral pulses are palpable. Psych: Alert, awake and oriented, normal mood CNS:  No cranial nerve  deficits.  Power equal in all extremities.  No sensory deficits noted.  No cerebellar signs.   Skin: Warm and dry.  No rashes noted. .  Data Review: I have personally reviewed the following laboratory data and studies,  CBC: Recent Labs  Lab 11/20/18 0910 11/22/18 0529 11/23/18 2033 11/24/18 0517 11/26/18 0505  WBC 5.7 4.6 7.4 9.1 7.5  NEUTROABS  --   --  6.2   --  5.1  HGB 15.4 13.6 13.9 13.3 12.7*  HCT 49.8 44.2 43.5 42.2 40.3  MCV 78.9* 80.5 78.8* 78.9* 78.7*  PLT 205 193 180 175 166   Basic Metabolic Panel: Recent Labs  Lab 11/22/18 0529 11/23/18 0437 11/24/18 0517 11/25/18 0557 11/26/18 0505  NA 138 137 135 134* 133*  K 3.6 3.6 3.6 3.5 3.7  CL 103 103 100 99 97*  CO2 GLUCOSE 94 86 101* 97 104*  BUN <5* CREATININE 0.89 0.94 0.74 0.81 0.84  CALCIUM 8.8* 8.9 8.7* 8.7* 8.8*  MG  --   --  2.0  --  2.2   Liver Function Tests: Recent Labs  Lab 11/22/18 0529 11/23/18 0437 11/24/18 0517 11/25/18 0557 11/26/18 0505  AST 149* 162* 306* 429* 534*  ALT 440* 393* 523* 672* 787*  ALKPHOS 121 118 129* 146* 186*  BILITOT 4.4* 4.9* 5.8* 6.8* 3.7*  PROT 7.0 7.2 6.8 6.9 7.1  ALBUMIN 3.7 3.7 3.4* 3.5 3.4*   Recent Labs  Lab 11/20/18 0910 11/24/18 0517 11/25/18 0557 11/26/18 0505  LIPASE 25 383* 74* 42   No results for input(s): AMMONIA in the last 168 hours. Cardiac Enzymes: No results for input(s): CKTOTAL, CKMB, CKMBINDEX, TROPONINI in the last 168 hours. BNP (last 3 results) No results for input(s): BNP in the last 8760 hours.  ProBNP (last 3 results) No results for input(s): PROBNP in the last 8760 hours.  CBG: No results for input(s): GLUCAP in the last 168 hours. Recent Results (from the past 240 hour(s))  Surgical pcr screen     Status: None   Collection Time: 11/23/18  6:43 AM  Result Value Ref Range Status   MRSA, PCR NEGATIVE NEGATIVE Final   Staphylococcus aureus NEGATIVE NEGATIVE Final    Comment: (NOTE) The Xpert SA Assay (FDA approved for NASAL specimens in patients 34 years of age and older), is one component of a comprehensive surveillance program. It is not intended to diagnose infection nor to guide or monitor treatment. Performed at [facility], [address] [place], [place] [address]      Studies: Dg Ercp  Result Date: 11/25/2018 CLINICAL DATA:   43 year old male with a history of choledocholithiasis EXAM: ERCP TECHNIQUE: Multiple spot images obtained with the fluoroscopic device and submitted for interpretation post-procedure. FLUOROSCOPY TIME:  Fluoroscopy Time:  5 minutes 5 seconds COMPARISON:  None. FINDINGS: Limited images during ERCP. Initial image demonstrates endoscope projecting over the upper abdomen with a safety wire in place and partial opacification of the extrahepatic biliary ducts. Vague filling defects are present, potentially gas and/or debris. IMPRESSION: Limited images during ERCP demonstrates partial opacification of the extrahepatic biliary ducts, with small vague filling defects, either air or debris/stones. Please refer to the dictated operative report for full details of intraoperative findings and procedure. Electronically Signed   By: Gilmer Mor D.O.   On: 11/25/2018 12:42    Scheduled Meds: . acetaminophen  1,000 mg Oral Q6H  . enoxaparin (LOVENOX) injection  40 mg Subcutaneous Q24H  . gabapentin  300 mg Oral BID  . ibuprofen  600 mg Oral TID  . lip balm  1 application Topical BID  . loratadine  10 mg Oral Daily  . pantoprazole (PROTONIX) IV  40 mg Intravenous Q24H  . psyllium  1 packet Oral Daily    Continuous Infusions: . lactated ringers Stopped (11/25/18 1400)  . ondansetron (ZOFRAN) IV       Joycelyn Das, MD  [facility] 11/26/2018

## 2018-11-27 LAB — COMPREHENSIVE METABOLIC PANEL
ALT: 896 U/L — ABNORMAL HIGH (ref 0–44)
AST: 646 U/L — ABNORMAL HIGH (ref 15–41)
Albumin: 3.3 g/dL — ABNORMAL LOW (ref 3.5–5.0)
Alkaline Phosphatase: 186 U/L — ABNORMAL HIGH (ref 38–126)
Anion gap: 10 (ref 5–15)
BUN: 13 mg/dL (ref 6–20)
CO2: 26 mmol/L (ref 22–32)
Calcium: 8.7 mg/dL — ABNORMAL LOW (ref 8.9–10.3)
Chloride: 99 mmol/L (ref 98–111)
Creatinine, Ser: 0.65 mg/dL (ref 0.61–1.24)
GFR calc Af Amer: >60 mL/min (ref >60)
GFR calc non Af Amer: >60 mL/min (ref >60)
Glucose, Bld: 116 mg/dL — ABNORMAL HIGH (ref 70–99)
Potassium: 3.2 mmol/L — ABNORMAL LOW (ref 3.5–5.1)
Sodium: 135 mmol/L (ref 135–145)
Total Bilirubin: 3.3 mg/dL — ABNORMAL HIGH (ref 0.3–1.2)
Total Protein: 6.8 g/dL (ref 6.5–8.1)

## 2018-11-27 LAB — IRON AND TIBC
Iron: 67 ug/dL (ref 45–182)
Saturation Ratios: 16 % — ABNORMAL LOW (ref 17.9–39.5)
TIBC: 411 ug/dL (ref 250–450)
UIBC: 344 ug/dL

## 2018-11-27 LAB — MAGNESIUM: Magnesium: 2 mg/dL (ref 1.7–2.4)

## 2018-11-27 MED ORDER — POTASSIUM CHLORIDE CRYS ER 20 MEQ PO TBCR
60.0000 meq | EXTENDED_RELEASE_TABLET | Freq: Once | ORAL | Status: AC
  Administered 2018-11-27: 09:00:00 60 meq via ORAL
  Filled 2018-11-27: qty 3

## 2018-11-27 NOTE — Discharge Instructions (Signed)
If you have any questions or concerns about your incisions please email a picture of your incisions to [email or web address] prior to your appointment  [facility]   Please note, because of the corona virus, we are doing the majority of our follow visits by "e" visit.  Our staff will instruct you when you make your follow up appointment.  LAPAROSCOPIC SURGERY: POST OP INSTRUCTIONS Always review your discharge instruction sheet given to you by the facility where your surgery was performed. IF YOU HAVE DISABILITY OR FAMILY LEAVE FORMS, YOU MUST BRING THEM TO THE OFFICE FOR PROCESSING.   DO NOT GIVE THEM TO YOUR DOCTOR.  PAIN CONTROL  1. First take acetaminophen (Tylenol) AND/or ibuprofen (Advil) to control your pain after surgery.  Follow directions on package.  Taking acetaminophen (Tylenol) and/or ibuprofen (Advil) regularly after surgery will help to control your pain and lower the amount of prescription pain medication you may need.  You should not take more than 4,000 mg (4 grams) of acetaminophen (Tylenol) in 24 hours.  You should not take ibuprofen (Advil), aleve, motrin, naprosyn or other NSAIDS if you have a history of stomach ulcers or chronic kidney disease.  2. A prescription for pain medication may be given to you upon discharge.  Take your pain medication as prescribed, if you still have uncontrolled pain after taking acetaminophen (Tylenol) or ibuprofen (Advil). 3. Use ice packs to help control pain. 4. If you need a refill on your pain medication, please contact your pharmacy.  They will contact our office to request authorization. Prescriptions will not be filled after 5pm or on week-ends.  HOME MEDICATIONS 5. Take your usually prescribed medications unless otherwise directed.  DIET 6. You should follow a light diet the first few days after arrival home.  Be sure to include lots of fluids daily. Avoid fatty, fried foods.   CONSTIPATION 7. It  is common to experience some constipation after surgery and if you are taking pain medication.  Increasing fluid intake and taking a stool softener (such as Colace) will usually help or prevent this problem from occurring.  A mild laxative (Milk of Magnesia or Miralax) should be taken according to package instructions if there are no bowel movements after 48 hours.  WOUND/INCISION CARE 8. Most patients will experience some swelling and bruising in the area of the incisions.  Ice packs will help.  Swelling and bruising can take several days to resolve.  9. Unless discharge instructions indicate otherwise, follow guidelines below  a. STERI-STRIPS - you may remove your outer bandages 48 hours after surgery, and you may shower at that time.  You have steri-strips (small skin tapes) in place directly over the incision.  These strips should be left on the skin for 7-10 days.   b. DERMABOND/SKIN GLUE - you may shower in 24 hours.  The glue will flake off over the next 2-3 weeks. 10. Any sutures or staples will be removed at the office during your follow-up visit.  ACTIVITIES 11. You may resume regular (light) daily activities beginning the next day--such as daily self-care, walking, climbing stairs--gradually increasing activities as tolerated.  You may have sexual intercourse when it is comfortable.  Refrain from any heavy lifting or straining until approved by your doctor. a. You may drive when you are no longer taking prescription pain medication, you can comfortably wear a seatbelt, and you can safely maneuver your car and apply brakes.  FOLLOW-UP 12. Call our office for a follow up  with Dr. Michaell Cowing in 3 to 4 weeks.  Most likely you will have an "e" visit.   WHEN TO CALL YOUR DOCTOR: 1. Fever over 101.0 2. Inability to urinate 3. Continued bleeding from incision. 4. Increased pain, redness, or drainage from the incision. 5. Increasing abdominal pain  The clinic staff is available to answer your  questions during regular business hours.  Please dont hesitate to call and ask to speak to one of the nurses for clinical concerns.  If you have a medical emergency, go to the nearest emergency room or call 911.  A surgeon from [facility] is always on call at the hospital. [address], [place], [place]  [address] ? [address] [address], [place], [place]   [address] [phone] ? [phone] ? FAX [phone]    Managing Your Pain After Surgery Without Opioids    Thank you for participating in our program to help patients manage their pain after surgery without opioids. This is part of our effort to provide you with the best care possible, without exposing you or your family to the risk that opioids pose.  What pain can I expect after surgery? You can expect to have some pain after surgery. This is normal. The pain is typically worse the day after surgery, and quickly begins to get better. Many studies have found that many patients are able to manage their pain after surgery with Over-the-Counter (OTC) medications such as Tylenol and Motrin. If you have a condition that does not allow you to take Tylenol or Motrin, notify your surgical team.  How will I manage my pain? The best strategy for controlling your pain after surgery is around the clock pain control with Tylenol (acetaminophen) and Motrin (ibuprofen or Advil). Alternating these medications with each other allows you to maximize your pain control. In addition to Tylenol and Motrin, you can use heating pads or ice packs on your incisions to help reduce your pain.  How will I alternate your regular strength over-the-counter pain medication? You will take a dose of pain medication every three hours. ; Start by taking 650 mg of Tylenol (2 pills of 325 mg) ; 3 hours later take 600 mg of Motrin (3 pills of 200 mg) ; 3 hours after taking the Motrin take 650 mg of Tylenol ; 3 hours after that take 600 mg  of Motrin.   - 1 -  See example - if your first dose of Tylenol is at 12:00 PM   12:00 PM Tylenol 650 mg (2 pills of 325 mg)  3:00 PM Motrin 600 mg (3 pills of 200 mg)  6:00 PM Tylenol 650 mg (2 pills of 325 mg)  9:00 PM Motrin 600 mg (3 pills of 200 mg)  Continue alternating every 3 hours   We recommend that you follow this schedule around-the-clock for at least 3 days after surgery, or until you feel that it is no longer needed. Use the table on the last page of this handout to keep track of the medications you are taking. Important: Do not take more than  of Tylenol or  of Motrin in a 24-hour period. Do not take ibuprofen/Motrin if you have a history of bleeding stomach ulcers, severe kidney disease, &/or actively taking a blood thinner  What if I still have pain? If you have pain that is not controlled with the over-the-counter pain medications (Tylenol and Motrin or Advil) you might have what we call breakthrough pain. You  will receive a prescription for a small amount of an opioid pain medication such as Oxycodone, Tramadol, or Tylenol with Codeine. Use these opioid pills in the first 24 hours after surgery if you have breakthrough pain. Do not take more than 1 pill every 4-6 hours.  If you still have uncontrolled pain after using all opioid pills, don't hesitate to call our staff using the number provided. We will help make sure you are managing your pain in the best way possible, and if necessary, we can provide a prescription for additional pain medication.   Day 1    Time  Name of Medication Number of pills taken  Amount of Acetaminophen  Pain Level   Comments  AM PM       AM PM       AM PM       AM PM       AM PM       AM PM       AM PM       AM PM       Total Daily amount of Acetaminophen Do not take more than  3,000 mg per day      Day 2    Time  Name of Medication Number of pills taken  Amount of Acetaminophen  Pain Level   Comments    AM PM       AM PM       AM PM       AM PM       AM PM       AM PM       AM PM       AM PM       Total Daily amount of Acetaminophen Do not take more than  3,000 mg per day      Day 3    Time  Name of Medication Number of pills taken  Amount of Acetaminophen  Pain Level   Comments  AM PM       AM PM       AM PM       AM PM          AM PM       AM PM       AM PM       AM PM       Total Daily amount of Acetaminophen Do not take more than  3,000 mg per day      Day 4    Time  Name of Medication Number of pills taken  Amount of Acetaminophen  Pain Level   Comments  AM PM       AM PM       AM PM       AM PM       AM PM       AM PM       AM PM       AM PM       Total Daily amount of Acetaminophen Do not take more than  3,000 mg per day      Day 5    Time  Name of Medication Number of pills taken  Amount of Acetaminophen  Pain Level   Comments  AM PM       AM PM       AM PM       AM PM       AM PM  AM PM       AM PM       AM PM       Total Daily amount of Acetaminophen Do not take more than  3,000 mg per day       Day 6    Time  Name of Medication Number of pills taken  Amount of Acetaminophen  Pain Level  Comments  AM PM       AM PM       AM PM       AM PM       AM PM       AM PM       AM PM       AM PM       Total Daily amount of Acetaminophen Do not take more than  3,000 mg per day      Day 7    Time  Name of Medication Number of pills taken  Amount of Acetaminophen  Pain Level   Comments  AM PM       AM PM       AM PM       AM PM       AM PM       AM PM       AM PM       AM PM       Total Daily amount of Acetaminophen Do not take more than  3,000 mg per day        For additional information about how and where to safely dispose of unused opioid medications - [email or web address]  Disclaimer: This document contains information and/or instructional materials adapted from [facility] for the typical patient with your condition. It does not replace medical advice from your health care provider because your experience may differ from that of the typical patient. Talk to your health care provider if you have any questions about this document, your condition or your treatment plan. Adapted from [facility]   Gallbladder Eating Plan If you have a gallbladder condition, you may have trouble digesting fats. Eating a low-fat diet can help reduce your symptoms, and may be helpful before and after having surgery to remove your gallbladder (cholecystectomy). Your health care provider may recommend that you work with a diet and nutrition specialist (dietitian) to help you reduce the amount of fat in your diet. What are tips for following this plan? General guidelines  Limit your fat intake to less than 30% of your total daily calories. If you eat around 1,800 calories each day, this is less than 60 grams (g) of fat per day.  Fat is an important part of a healthy diet. Eating a low-fat diet can make it hard to maintain a healthy body weight. Ask your dietitian how much fat, calories, and other nutrients you need each day.  Eat small, frequent meals throughout the day instead of three large meals.  Drink at least 8-10 cups of fluid a day. Drink enough fluid to keep your urine clear or pale yellow.  Limit alcohol intake to no more than 1 drink a day for nonpregnant women and 2 drinks a day for men. One drink equals 12 oz of beer, 5 oz of wine, or 1 oz of hard liquor. Reading food labels  Check Nutrition Facts on food labels for the amount of fat per serving. Choose foods with less than 3 grams of fat per serving. Shopping  Choose nonfat and  low-fat healthy foods. Look for the words nonfat, low fat, or fat free.  Avoid buying processed or prepackaged foods. Cooking  Cook using low-fat methods, such as baking, broiling, grilling, or boiling.  Cook with small  amounts of healthy fats, such as olive oil, grapeseed oil, canola oil, or sunflower oil. What foods are recommended?   All fresh, frozen, or canned fruits and vegetables.  Whole grains.  Low-fat or non-fat (skim) milk and yogurt.  Lean meat, skinless poultry, fish, eggs, and beans.  Low-fat protein supplement powders or drinks.  Spices and herbs. What foods are not recommended?  High-fat foods. These include baked goods, fast food, fatty cuts of meat, ice cream, french toast, sweet rolls, pizza, cheese bread, foods covered with butter, creamy sauces, or cheese.  Fried foods. These include french fries, tempura, battered fish, breaded chicken, fried breads, and sweets.  Foods with strong odors.  Foods that cause bloating and gas. Summary  A low-fat diet can be helpful if you have a gallbladder condition, or before and after gallbladder surgery.  Limit your fat intake to less than 30% of your total daily calories. This is about 60 g of fat if you eat 1,800 calories each day.  Eat small, frequent meals throughout the day instead of three large meals. This information is not intended to replace advice given to you by your health care provider. Make sure you discuss any questions you have with your health care provider. Document Released: 08/10/2013 Document Revised: 09/12/2016 Document Reviewed: 09/12/2016 Elsevier Interactive Patient Education  2019 [employer].

## 2018-11-27 NOTE — Progress Notes (Addendum)
PROGRESS NOTE  Alejandro Willis EAV:[id number] DOB: [birth year] DOA: 11/20/2018 PCP: Patient, No Pcp Per   LOS: 6 days   Brief narrative: Patientis a 43 y.o.male with no past medical historywhopresentedto the ED with hx ofabdominal pain and wasfound to have cholecystitis and choledocholithiasis. UnderwentERCP and cholecystectomy on 11/24/2018. Patient underwent a repeat ERCP on 11/25/2018.  Subjective: Patient had a bout of vomiting after having a sandwich this morning.  Denies any abdominal pain.  Denies further nausea or vomiting.  Denies cough, shortness of breath, fever or chills.  Assessment/Plan:  Principal Problem:   Elevated LFTs Active Problems:   Choledocholithiasis   Transaminitis   Acute on chronic cholecystitis s/p lap cholecystectomy 11/23/2018   Nausea without vomiting  Acute choledocholithiasis with elevated LFTs GI and general surgery on board.  Status post ERCP with removal of CBD stones and stent placement on 11/23/2018 but with trending of LFTs. Status post laparoscopic cholecystectomy with intraoperative cholangiogram on 11/23/2018 by general surgery.  Patient underwent ERCP with sphincterectomy repeat on 11/25/18 due to rising LFTs.  CBD stones have been clear at this time.  Bilirubin has decreased but AST ALT still high and trending up. Patient seen by surgery and is a stable for discharge from surgical point of view. I spoke with GI on-call Dr. Marca Ancona about rising LFTs.  She will follow the patient today.  Patient will likely need further follow-up with LFTs.    Marijuana use Has been counseled  Mild hypokalemia.  Will replenish.  Check BMP in a.m.  GERD/ Hiatal hernia -Continue Protonix   VTE Prophylaxis: Lovenox  Code Status: Full code  Family Communication: Spoke with the patient's wife on the phone.  I updated her about the clinical condition of the patient.  Disposition Plan: Home likely in 1 to 2 days.  Follow GI recommendations on discharge.   Monitor daily LFTs   Consultants:  GI, general surgery  Procedures:  Cholecystectomy with intraoperative cholangiogram.  ERCP, 11/23/2018, 11/25/2018  Antibiotics: Anti-infectives (From admission, onward)   Start     Dose/Rate Route Frequency Ordered Stop   11/20/18 2245  Ampicillin-Sulbactam (UNASYN) 3 g in sodium chloride 0.9 % 100 mL IVPB  Status:  Discontinued     3 g 200 mL/hr over 30 Minutes Intravenous Every 6 hours 11/20/18 2224 11/23/18 1416   11/20/18 2230  levofloxacin (LEVAQUIN) IVPB 500 mg  Status:  Discontinued     500 mg 100 mL/hr over 60 Minutes Intravenous Every 24 hours 11/20/18 2223 11/20/18 2223     Objective: Vitals:   11/26/18 2051 11/27/18 0530  BP: 124/76 (!) 143/84  Pulse: 84 77  Resp: 18 18  Temp: 98.8 F (37.1 C) 98.3 F (36.8 C)  SpO2: 97% 99%    Intake/Output Summary (Last 24 hours) at 11/27/2018 0936 Last data filed at 11/27/2018 0900 Gross per 24 hour  Intake 1180 ml  Output 100 ml  Net 1080 ml   Filed Weights   11/20/18 0825 11/23/18 0729 11/25/18 1023  Weight: 90.7 kg 90.7 kg 90.7 kg   Body mass index is 32.27 kg/m.   Physical Exam:  General: Obese, not in obvious distress HENT: Normocephalic, pupils equally reacting to light and accommodation.   Oral mucosa is moist.  Chest:  Clear breath sounds.  Diminished breath sounds bilaterally. No crackles or wheezes.  CVS: S1 &S2 heard. No murmur.  Regular rate and rhythm. Abdomen: Soft, nontender, nondistended.  Bowel sounds are heard.  Liver is not palpable, no abdominal mass palpated.  Laparoscopic scar noted Extremities: No cyanosis, clubbing or edema.  Peripheral pulses are palpable. Psych: Alert, awake and oriented, normal mood CNS:  No cranial nerve deficits.  Power equal in all extremities.  No sensory deficits noted.  No cerebellar signs.   Skin: Warm and dry.  No rashes noted.   Data Review: I have personally reviewed the following laboratory data and studies,  CBC: Recent  Labs  Lab 12/09/18 0529 11/23/18 2033 11/24/18 0517 11/26/18 0505  WBC 4.6 7.4 9.1 7.5  NEUTROABS  --  6.2  --  5.1  HGB 13.6 13.9 13.3 12.7*  HCT 44.2 43.5 42.2 40.3  MCV 80.5 78.8* 78.9* 78.7*  PLT 193 180 175 166   Basic Metabolic Panel: Recent Labs  Lab 11/23/18 0437 11/24/18 0517 11/25/18 0557 11/26/18 0505 11/27/18 0508  NA 137 135 134* 133* 135  K 3.6 3.6 3.5 3.7 3.2*  CL 103 100 99 97* 99  CO2 25 24 25 26 26   GLUCOSE 86 101* 97 104* 116*  BUN 6 8 7 9 13   CREATININE 0.94 0.74 0.81 0.84 0.65  CALCIUM 8.9 8.7* 8.7* 8.8* 8.7*  MG  --  2.0  --  2.2 2.0   Liver Function Tests: Recent Labs  Lab 11/23/18 0437 11/24/18 0517 11/25/18 0557 11/26/18 0505 11/27/18 0508  AST 162* 306* 429* 534* 646*  ALT 393* 523* 672* 787* 896*  ALKPHOS 118 129* 146* 186* 186*  BILITOT 4.9* 5.8* 6.8* 3.7* 3.3*  PROT 7.2 6.8 6.9 7.1 6.8  ALBUMIN 3.7 3.4* 3.5 3.4* 3.3*   Recent Labs  Lab 11/24/18 0517 11/25/18 0557 11/26/18 0505  LIPASE 383* 74* 42   No results for input(s): AMMONIA in the last 168 hours. Cardiac Enzymes: No results for input(s): CKTOTAL, CKMB, CKMBINDEX, TROPONINI in the last 168 hours. BNP (last 3 results) No results for input(s): BNP in the last 8760 hours.  ProBNP (last 3 results) No results for input(s): PROBNP in the last 8760 hours.  CBG: No results for input(s): GLUCAP in the last 168 hours. Recent Results (from the past 240 hour(s))  Surgical pcr screen     Status: None   Collection Time: 11/23/18  6:43 AM  Result Value Ref Range Status   MRSA, PCR NEGATIVE NEGATIVE Final   Staphylococcus aureus NEGATIVE NEGATIVE Final    Comment: (NOTE) The Xpert SA Assay (FDA approved for NASAL specimens in patients 86 years of age and older), is one component of a comprehensive surveillance program. It is not intended to diagnose infection nor to guide or monitor treatment. Performed at [facility], [address], [place],  [place] [address]      Studies: Dg Ercp  Result Date: 11/25/2018 CLINICAL DATA:  43 year old male with a history of choledocholithiasis EXAM: ERCP TECHNIQUE: Multiple spot images obtained with the fluoroscopic device and submitted for interpretation post-procedure. FLUOROSCOPY TIME:  Fluoroscopy Time:  5 minutes 5 seconds COMPARISON:  None. FINDINGS: Limited images during ERCP. Initial image demonstrates endoscope projecting over the upper abdomen with a safety wire in place and partial opacification of the extrahepatic biliary ducts. Vague filling defects are present, potentially gas and/or debris. IMPRESSION: Limited images during ERCP demonstrates partial opacification of the extrahepatic biliary ducts, with small vague filling defects, either air or debris/stones. Please refer to the dictated operative report for full details of intraoperative findings and procedure. Electronically Signed   By: Gilmer Mor D.O.   On: 11/25/2018 12:42    Scheduled Meds: .  acetaminophen  1,000 mg Oral Q6H  . enoxaparin (LOVENOX) injection  40 mg Subcutaneous Q24H  . gabapentin  300 mg Oral BID  . ibuprofen  600 mg Oral TID  . lip balm  1 application Topical BID  . loratadine  10 mg Oral Daily  . pantoprazole (PROTONIX) IV  40 mg Intravenous Q24H  . psyllium  1 packet Oral Daily    Continuous Infusions: . lactated ringers Stopped (11/25/18 1400)  . ondansetron (ZOFRAN) IV      Joycelyn DasLaxman Kriya Westra, MD  [facility] 11/27/2018

## 2018-11-27 NOTE — Progress Notes (Signed)
[facility]:  [phone] General Surgery Progress Note   LOS: 6 days  POD -  2 Days Post-Op  Chief Complaint: Choledocholithiasis  Assessment and Plan: 1.  Choledocholithiasis   - s/p Lap Chole with IOC, Dr. Karie Soda, 4/6  - s/p ERCP with biliary sphincterotomy, balloon extraction and placement of pancreatic stent, Dr. Vida Rigger, 4/6 and again 4/8 with successful clearance of CBD  Okay to go home from general surgery.  To see Dr. Michaell Cowing in 3 to 4 weeks.  We have left general surgery discharge instructions.  2. Elevated LFT  ALT - 896 - 11/27/2018  T. Bili - 3.3 - 11/27/2018 2.  DVT prophylaxis - Lovenox   Principal Problem:   Elevated LFTs Active Problems:   Choledocholithiasis   Transaminitis   Acute on chronic cholecystitis s/p lap cholecystectomy 11/23/2018   Nausea without vomiting  Subjective:  Nauseated last PM, but said he ate too fast.  Better this AM.  Tried to call wife, but she was on the phone with the nurse.  Objective:   Vitals:   11/26/18 2051 11/27/18 0530  BP: 124/76 (!) 143/84  Pulse: 84 77  Resp: 18 18  Temp: 98.8 F (37.1 C) 98.3 F (36.8 C)  SpO2: 97% 99%     Intake/Output from previous day:  04/09 0701 - 04/10 0700 In: 1300 [P.O.:1300] Out: 100 [Urine:100]  Intake/Output this shift:  No intake/output data recorded.   Physical Exam:   General: WN M who is alert and oriented.    HEENT: Normal. Pupils equal. .   Lungs: Clear   Abdomen: Soft   Wound: Umbilical wound looks good.   Lab Results:    Recent Labs    11/26/18 0505  WBC 7.5  HGB 12.7*  HCT 40.3  PLT 166    BMET   Recent Labs    11/26/18 0505 11/27/18 0508  NA 133* 135  K 3.7 3.2*  CL 97* 99  CO2 26 26  GLUCOSE 104* 116*  BUN 9 13  CREATININE 0.84 0.65  CALCIUM 8.8* 8.7*    PT/INR  No results for input(s): LABPROT, INR in the last 72 hours.  ABG  No results for input(s): PHART, HCO3 in the last 72 hours.  Invalid input(s): PCO2,  PO2   Studies/Results:  Dg Ercp  Result Date: 11/25/2018 CLINICAL DATA:  43 year old male with a history of choledocholithiasis EXAM: ERCP TECHNIQUE: Multiple spot images obtained with the fluoroscopic device and submitted for interpretation post-procedure. FLUOROSCOPY TIME:  Fluoroscopy Time:  5 minutes 5 seconds COMPARISON:  None. FINDINGS: Limited images during ERCP. Initial image demonstrates endoscope projecting over the upper abdomen with a safety wire in place and partial opacification of the extrahepatic biliary ducts. Vague filling defects are present, potentially gas and/or debris. IMPRESSION: Limited images during ERCP demonstrates partial opacification of the extrahepatic biliary ducts, with small vague filling defects, either air or debris/stones. Please refer to the dictated operative report for full details of intraoperative findings and procedure. Electronically Signed   By: Gilmer Mor D.O.   On: 11/25/2018 12:42     Anti-infectives:   Anti-infectives (From admission, onward)   Start     Dose/Rate Route Frequency Ordered Stop   11/20/18 2245  Ampicillin-Sulbactam (UNASYN) 3 g in sodium chloride 0.9 % 100 mL IVPB  Status:  Discontinued     3 g 200 mL/hr over 30 Minutes Intravenous Every 6 hours 11/20/18 2224 11/23/18 1416   11/20/18 2230  levofloxacin (  LEVAQUIN) IVPB 500 mg  Status:  Discontinued     500 mg 100 mL/hr over 60 Minutes Intravenous Every 24 hours 11/20/18 2223 11/20/18 2223      Ovidio Kinavid Shannin Naab, MD, FACS Pager: [phone] [facility]: [phone] 11/27/2018

## 2018-11-27 NOTE — Progress Notes (Addendum)
Subjective: The patient was seen and examined at bedside. He woke up at 4 AM feeling extremely hungry, finished three fourths of a [place] sandwich and had immediate vomiting thereafter, which he attributes to eating too fast. He denies nausea, is been able to have grits for breakfast without further vomiting. Last bowel movement was on Wednesday, after cholecystectomy. Complains of mild discomfort at surgical incisional site, otherwise denies abdominal pain. Denies fever, chills or rigors.  Objective: Vital signs in last 24 hours: Temp:  [98.3 F (36.8 C)-99.4 F (37.4 C)] 98.3 F (36.8 C) (04/10 0530) Pulse Rate:  [77-84] 77 (04/10 0530) Resp:  [17-18] 18 (04/10 0530) BP: (124-143)/(76-87) 143/84 (04/10 0530) SpO2:  [96 %-99 %] 99 % (04/10 0530) Weight change:  Last BM Date: 11/26/18  PE: No pallor, mild icterus GENERAL: Not in distress ABDOMEN: Mild distention, soft, non-tender, normal active bowel sounds, surgical incisions appear to be healing EXTREMITIES: No deformity  Lab Results: Results for orders placed or performed during the hospital encounter of 11/20/18 (from the past 48 hour(s))  Lipase, blood     Status: None   Collection Time: 11/26/18  5:05 AM  Result Value Ref Range   Lipase 42 11 - 51 U/L    Comment: Performed at [facility], [address] [place], [place] [address]  Magnesium     Status: None   Collection Time: 11/26/18  5:05 AM  Result Value Ref Range   Magnesium 2.2 1.7 - 2.4 mg/dL    Comment: Performed at [facility], [address] [place], [place] [address]  Comprehensive metabolic panel     Status: Abnormal   Collection Time: 11/26/18  5:05 AM  Result Value Ref Range   Sodium 133 (L) 135 - 145 mmol/L   Potassium 3.7 3.5 - 5.1 mmol/L   Chloride 97 (L) 98 - 111 mmol/L   CO2 26 22 - 32 mmol/L   Glucose, Bld 104 (H) 70 - 99 mg/dL   BUN 9 6 - 20 mg/dL   Creatinine, Ser 0.980.84 0.61 - 1.24 mg/dL   Calcium 8.8  (L) 8.9 - 10.3 mg/dL   Total Protein 7.1 6.5 - 8.1 g/dL   Albumin 3.4 (L) 3.5 - 5.0 g/dL   AST 119534 (H) 15 - 41 U/L   ALT 787 (H) 0 - 44 U/L   Alkaline Phosphatase 186 (H) 38 - 126 U/L   Total Bilirubin 3.7 (H) 0.3 - 1.2 mg/dL   GFR calc non Af Amer >60 >60 mL/min   GFR calc Af Amer >60 >60 mL/min   Anion gap 10 5 - 15    Comment: Performed at [facility], [address] [place], [place] [address]  CBC with Differential/Platelet     Status: Abnormal   Collection Time: 11/26/18  5:05 AM  Result Value Ref Range   WBC 7.5 4.0 - 10.5 K/uL    Comment: WHITE COUNT CONFIRMED ON SMEAR   RBC 5.12 4.22 - 5.81 MIL/uL   Hemoglobin 12.7 (L) 13.0 - 17.0 g/dL   HCT 95.640.3 21.339.0 - 08.652.0 %   MCV 78.7 (L) 80.0 - 100.0 fL   MCH 24.8 (L) 26.0 - 34.0 pg   MCHC 31.5 30.0 - 36.0 g/dL   RDW 57.815.3 46.911.5 - 62.915.5 %   Platelets 166 150 - 400 K/uL   nRBC 0.0 0.0 - 0.2 %   Neutrophils Relative % 68 %   Neutro Abs 5.1 1.7 - 7.7 K/uL  Lymphocytes Relative 21 %   Lymphs Abs 1.6 0.7 - 4.0 K/uL   Monocytes Relative 11 %   Monocytes Absolute 0.8 0.1 - 1.0 K/uL   Eosinophils Relative 0 %   Eosinophils Absolute 0.0 0.0 - 0.5 K/uL   Basophils Relative 0 %   Basophils Absolute 0.0 0.0 - 0.1 K/uL   Immature Granulocytes 0 %   Abs Immature Granulocytes 0.03 0.00 - 0.07 K/uL    Comment: Performed at [facility], [address], [place], [place] [address]  Magnesium     Status: None   Collection Time: 11/27/18  5:08 AM  Result Value Ref Range   Magnesium 2.0 1.7 - 2.4 mg/dL    Comment: Performed at [facility], [address] [place], [place] [address]  Comprehensive metabolic panel     Status: Abnormal   Collection Time: 11/27/18  5:08 AM  Result Value Ref Range   Sodium 135 135 - 145 mmol/L   Potassium 3.2 (L) 3.5 - 5.1 mmol/L   Chloride 99 98 - 111 mmol/L   CO2 26 22 - 32 mmol/L   Glucose, Bld 116 (H) 70 - 99 mg/dL   BUN 13 6 - 20 mg/dL   Creatinine,  Ser 2.19 0.61 - 1.24 mg/dL   Calcium 8.7 (L) 8.9 - 10.3 mg/dL   Total Protein 6.8 6.5 - 8.1 g/dL   Albumin 3.3 (L) 3.5 - 5.0 g/dL   AST 758 (H) 15 - 41 U/L   ALT 896 (H) 0 - 44 U/L   Alkaline Phosphatase 186 (H) 38 - 126 U/L   Total Bilirubin 3.3 (H) 0.3 - 1.2 mg/dL   GFR calc non Af Amer >60 >60 mL/min   GFR calc Af Amer >60 >60 mL/min   Anion gap 10 5 - 15    Comment: Performed at [facility], [address] [place], [place] [address]    Studies/Results: Dg Ercp  Result Date: 11/25/2018 CLINICAL DATA:  43 year old male with a history of choledocholithiasis EXAM: ERCP TECHNIQUE: Multiple spot images obtained with the fluoroscopic device and submitted for interpretation post-procedure. FLUOROSCOPY TIME:  Fluoroscopy Time:  5 minutes 5 seconds COMPARISON:  None. FINDINGS: Limited images during ERCP. Initial image demonstrates endoscope projecting over the upper abdomen with a safety wire in place and partial opacification of the extrahepatic biliary ducts. Vague filling defects are present, potentially gas and/or debris. IMPRESSION: Limited images during ERCP demonstrates partial opacification of the extrahepatic biliary ducts, with small vague filling defects, either air or debris/stones. Please refer to the dictated operative report for full details of intraoperative findings and procedure. Electronically Signed   By: Gilmer Mor D.O.   On: 11/25/2018 12:42    Medications: I have reviewed the patient's current medications.  Assessment: Choledocholithiasis and cholelithiasis, s/p cholecystectomy. ERCP on 11/23/2018 with sphincterotomy, balloon sweep and pancreatic stent placement. ERCP on 11/25/2018 with extension of sphincterotomy, balloon dilatation, epinephrine injection, removal of further CBD stones and removal of pancreatic stent  Total bilirubin trending down 6.8/3.7 to 3.3 ALP remains the same at 146/186/186 AST trending up 429/534/646 ALT trending up  672/787/896  Without abdominal pain, fever or worsening jaundice and with normal lipase  Plan: Ampulla appeared erythematous on both ERCPs, possibility of edema at the ampulla slowing drainage Downtrending bilirubin reassuring. Continue Ringer's lactate at 100 cc/h, antiemetics as needed every 6 hours, resumed on soft diet. Repeat liver enzymes in a.m., if downtrending plan discharge in a.m. tomorrow  To be noted: HAV IgM, HBcIgM and HCV Ab were negative on 11/20/2018. Will send labs for ceruloplasmin, alpha 1 antirypsin, ASMA ,AMA, ANA and iron profile to r/o other causes of hepatocellular dysfunction.   Kerin Salen 11/27/2018, 9:39 AM   Pager [phone] If no answer or after 5 PM call [phone]

## 2018-11-28 LAB — COMPREHENSIVE METABOLIC PANEL
ALT: 841 U/L — ABNORMAL HIGH (ref 0–44)
AST: 470 U/L — ABNORMAL HIGH (ref 15–41)
Albumin: 3.1 g/dL — ABNORMAL LOW (ref 3.5–5.0)
Alkaline Phosphatase: 223 U/L — ABNORMAL HIGH (ref 38–126)
Anion gap: 7 (ref 5–15)
BUN: 14 mg/dL (ref 6–20)
CO2: 27 mmol/L (ref 22–32)
Calcium: 8.7 mg/dL — ABNORMAL LOW (ref 8.9–10.3)
Chloride: 102 mmol/L (ref 98–111)
Creatinine, Ser: 0.76 mg/dL (ref 0.61–1.24)
GFR calc Af Amer: >60 mL/min (ref >60)
GFR calc non Af Amer: >60 mL/min (ref >60)
Glucose, Bld: 100 mg/dL — ABNORMAL HIGH (ref 70–99)
Potassium: 3.6 mmol/L (ref 3.5–5.1)
Sodium: 136 mmol/L (ref 135–145)
Total Bilirubin: 1.7 mg/dL — ABNORMAL HIGH (ref 0.3–1.2)
Total Protein: 6.3 g/dL — ABNORMAL LOW (ref 6.5–8.1)

## 2018-11-28 LAB — MITOCHONDRIAL ANTIBODIES: Mitochondrial M2 Ab, IgG: <20 Units (ref 0.0–20.0)

## 2018-11-28 LAB — ALPHA-1-ANTITRYPSIN: A-1 Antitrypsin, Ser: 182 mg/dL (ref 101–187)

## 2018-11-28 LAB — CERULOPLASMIN: Ceruloplasmin: 26.3 mg/dL (ref 16.0–31.0)

## 2018-11-28 LAB — ANTI-SMOOTH MUSCLE ANTIBODY, IGG: F-Actin IgG: 7 Units (ref 0–19)

## 2018-11-28 MED ORDER — IBUPROFEN 600 MG PO TABS: 600.0000 mg | ORAL_TABLET | Freq: Three times a day (TID) | ORAL | 0 refills | Status: DC | PRN

## 2018-11-28 MED ORDER — HYDROCORTISONE 2.5 % RE CREA: 1.0000 "application " | TOPICAL_CREAM | Freq: Four times a day (QID) | RECTAL | 0 refills | Status: DC | PRN

## 2018-11-28 MED ORDER — POLYETHYLENE GLYCOL 3350 17 G PO PACK: 17.0000 g | PACK | Freq: Every day | ORAL | 0 refills | Status: AC | PRN

## 2018-11-28 MED ORDER — TRAMADOL HCL 50 MG PO TABS: 50.0000 mg | ORAL_TABLET | Freq: Four times a day (QID) | ORAL | 0 refills | Status: DC | PRN

## 2018-11-28 MED ORDER — TRAMADOL HCL 50 MG PO TABS: 50.0000 mg | ORAL_TABLET | Freq: Four times a day (QID) | ORAL | 0 refills | Status: AC | PRN

## 2018-11-28 NOTE — Progress Notes (Signed)
Subjective: Patient reports having dark stools yesterday evening and today morning. Denies any abdominal pain. No further episodes of nausea or vomiting. No fever.  Objective: Vital signs in last 24 hours: Temp:  [98.2 F (36.8 C)-98.6 F (37 C)] 98.3 F (36.8 C) (04/11 0557) Pulse Rate:  [77-89] 77 (04/11 0557) Resp:  [18] 18 (04/11 0557) BP: (120-130)/(84-90) 120/84 (04/11 0557) SpO2:  [97 %-99 %] 97 % (04/11 0557) Weight change:  Last BM Date: 11/27/18  PE: No pallor, no icterus GENERAL: Appears comfortable ABDOMEN: Nondistended, nontender EXTREMITIES: No deformity  Lab Results: Results for orders placed or performed during the hospital encounter of 11/20/18 (from the past 48 hour(s))  Magnesium     Status: None   Collection Time: 11/27/18  5:08 AM  Result Value Ref Range   Magnesium 2.0 1.7 - 2.4 mg/dL    Comment: Performed at [facility], [address] [place], [place] [address]  Comprehensive metabolic panel     Status: Abnormal   Collection Time: 11/27/18  5:08 AM  Result Value Ref Range   Sodium 135 135 - 145 mmol/L   Potassium 3.2 (L) 3.5 - 5.1 mmol/L   Chloride 99 98 - 111 mmol/L   CO2 26 22 - 32 mmol/L   Glucose, Bld 116 (H) 70 - 99 mg/dL   BUN 13 6 - 20 mg/dL   Creatinine, Ser 7.54 0.61 - 1.24 mg/dL   Calcium 8.7 (L) 8.9 - 10.3 mg/dL   Total Protein 6.8 6.5 - 8.1 g/dL   Albumin 3.3 (L) 3.5 - 5.0 g/dL   AST 492 (H) 15 - 41 U/L   ALT 896 (H) 0 - 44 U/L   Alkaline Phosphatase 186 (H) 38 - 126 U/L   Total Bilirubin 3.3 (H) 0.3 - 1.2 mg/dL   GFR calc non Af Amer >60 >60 mL/min   GFR calc Af Amer >60 >60 mL/min   Anion gap 10 5 - 15    Comment: Performed at [facility], [address] [place], [place] [address]  Ceruloplasmin     Status: None   Collection Time: 11/27/18  2:00 PM  Result Value Ref Range   Ceruloplasmin 26.3 16.0 - 31.0 mg/dL    Comment: (NOTE) Performed At: [facility] [address]  [place], [place] [id number] Jolene Schimke MD [id number]   Alpha-1-antitrypsin     Status: None   Collection Time: 11/27/18  2:00 PM  Result Value Ref Range   A-1 Antitrypsin, Ser 182 101 - 187 mg/dL    Comment: (NOTE) Performed At: [facility] [address] [place], [place] [id number] Jolene Schimke MD UP:[id number]   Iron and TIBC     Status: Abnormal   Collection Time: 11/27/18  2:00 PM  Result Value Ref Range   Iron 67 45 - 182 ug/dL   TIBC 929 244 - 628 ug/dL   Saturation Ratios 16 (L) 17.9 - 39.5 %   UIBC 344 ug/dL    Comment: Performed at [facility], [address] [place], [place] [address]  Comprehensive metabolic panel     Status: Abnormal   Collection Time: 11/28/18  4:52 AM  Result Value Ref Range   Sodium 136 135 - 145 mmol/L   Potassium 3.6 3.5 - 5.1 mmol/L   Chloride 102 98 - 111 mmol/L   CO2 27 22 - 32 mmol/L   Glucose, Bld 100 (H) 70 - 99 mg/dL   BUN 14 6 - 20  mg/dL   Creatinine, Ser 6.290.76 0.61 - 1.24 mg/dL   Calcium 8.7 (L) 8.9 - 10.3 mg/dL   Total Protein 6.3 (L) 6.5 - 8.1 g/dL   Albumin 3.1 (L) 3.5 - 5.0 g/dL   AST 528470 (H) 15 - 41 U/L   ALT 841 (H) 0 - 44 U/L   Alkaline Phosphatase 223 (H) 38 - 126 U/L   Total Bilirubin 1.7 (H) 0.3 - 1.2 mg/dL   GFR calc non Af Amer >60 >60 mL/min   GFR calc Af Amer >60 >60 mL/min   Anion gap 7 5 - 15    Comment: Performed at [facility], [address] [place], [place] [address]    Studies/Results: No results found.  Medications: I have reviewed the patient's current medications.  Assessment: Status post ERCP for choledocholithiasis T bili near normal at 1.7 AST trended down to 470 ALT trended down to 841 ALP elevated at 223  Normal levels of ceruloplasmin, alpha 1 antitrypsin and iron profile Infectious markers negative for hepatitis A, hepatitis B, hepatitis C ASMA, AMA, ANA pending  Patient reports dark/black stools, however BUN is normal at 14,  upper GI bleed less likely, recommend to avoid NSAIDs.  Plan: Okay to DC home from GI standpoint. Recommend repeating hepatic panel in 1 week, to ensure liver enzymes continue to trend down or normalize. Will follow results of remaining lab as an outpatient.  Kerin Salenrya Amery Vandenbos 11/28/2018, 9:47 AM   Pager [phone] If no answer or after 5 PM call [phone]

## 2018-11-28 NOTE — Discharge Summary (Signed)
Physician Discharge Summary  Alejandro Willis WUJ:[id number] DOB: 03-May-1976 DOA: 11/20/2018  PCP: Patient, No Pcp Per  Admit date: 11/20/2018 Discharge date: 11/28/2018  Admitted From: Home  Discharge disposition: Home  Recommendations for Outpatient Follow-Up:    Follow up with GI and general surgery as outpatient.   Discharge Diagnosis:   Principal Problem:   Elevated LFTs Active Problems:   Choledocholithiasis   Transaminitis   Acute on chronic cholecystitis s/p lap cholecystectomy 11/23/2018   Nausea without vomiting   Discharge Condition: Improved.  Diet recommendation:  Regular.  Code status: Full.   History of Present Illness:   Patientis a 43 y.o.male with no past medical historywhopresentedto the ED with history ofabdominal pain and wasfound to have cholecystitis and choledocholithiasis. UnderwentERCP and cholecystectomy on 11/24/2018. Patient underwent a repeat ERCP on 11/25/2018.   Hospital Course:  Patient was admitted to the hospital and following conditions were addressed during hospitalization,  Acute choledocholithiasis with elevated LFTs Patient was seen by GI and general surgery.  Status post ERCP with removal of CBD stones and stent placement on 11/23/2018 but with trending up of LFTs. Status post laparoscopic cholecystectomy with intraoperative cholangiogram on 11/23/2018 by general surgery.  Patient underwent ERCP with sphincterectomy repeated on 11/25/18 due to rising LFTs.  CBD stones were then cleared at that time.  After the LFTs started trending down again he has been considered stable for disposition home.  Patient has been seen by GI and will follow-up with GI as outpatient.  Marijuana use Has been counseled  Mild hypokalemia.    Replenished   Medical Consultants:    General surgery,  Gastroenterology   Subjective:   Today, patient feels okay.  Denies any nausea, vomiting or abdominal pain.  Has tolerated diet.  Discharge  Exam:   Vitals:   11/27/18 2046 11/28/18 0557  BP: 130/90 120/84  Pulse: 89 77  Resp: 18 18  Temp: 98.2 F (36.8 C) 98.3 F (36.8 C)  SpO2: 99% 97%   Vitals:   11/27/18 0530 11/27/18 1410 11/27/18 2046 11/28/18 0557  BP: (!) 143/84 127/85 130/90 120/84  Pulse: 77 83 89 77  Resp: 18 18 18 18   Temp: 98.3 F (36.8 C) 98.6 F (37 C) 98.2 F (36.8 C) 98.3 F (36.8 C)  TempSrc: Oral Oral Oral Oral  SpO2: 99% 98% 99% 97%  Weight:      Height:        General exam: Appears calm and comfortable ,Not in distress HEENT:PERRL,Oral mucosa moist Respiratory system: Bilateral equal air entry, normal vesicular breath sounds, no wheezes or crackles  Cardiovascular system: S1 & S2 heard, RRR.  Gastrointestinal system: Abdomen is nondistended, soft and nontender. No organomegaly or masses felt. Normal bowel sounds heard.  Laparoscopic scar noted. Central nervous system: Alert and oriented. No focal neurological deficits. Extremities: No edema, no clubbing ,no cyanosis, distal peripheral pulses palpable. Skin: No rashes, lesions or ulcers,no icterus ,no pallor MSK: Normal muscle bulk,tone ,power    Procedures:    Cholecystectomy with intraoperative cholangiogram.  ERCP, 11/23/2018, 11/25/2018  The results of significant diagnostics from this hospitalization (including imaging, microbiology, ancillary and laboratory) are listed below for reference.     Diagnostic Studies:   Ct Abdomen Pelvis W Contrast  Result Date: 11/20/2018 CLINICAL DATA:  43 year old male with a history of 2 days upper abdominal pain and vomiting EXAM: CT ABDOMEN AND PELVIS WITH CONTRAST TECHNIQUE: Multidetector CT imaging of the abdomen and pelvis was performed using the standard protocol following  bolus administration of intravenous contrast. CONTRAST:  OMNIPAQUE IOHEXOL 300 MG/ML SOLN, 15mL ISOVUE-300 IOPAMIDOL (ISOVUE-300) INJECTION 61% COMPARISON:  07/22/2015 FINDINGS: Lower chest: No acute finding  Hepatobiliary: Unremarkable appearance of liver parenchyma. Calcified cholelithiasis at the dependent aspect of the gallbladder with no pericholecystic fluid or inflammatory changes. The course of the common bile duct unremarkable with no intrahepatic or extrahepatic biliary ductal dilatation. Pancreas: Unremarkable Spleen: Unremarkable Adrenals/Urinary Tract: Unremarkable appearance of the adrenal glands. No evidence of hydronephrosis of the right or left kidney. No nephrolithiasis. Unremarkable course of the bilateral ureters. Unremarkable appearance of the urinary bladder. Stomach/Bowel: Unremarkable stomach. Unremarkable small bowel. No abnormal distention. No transition point. Normal appendix. Diverticular disease without evidence of acute inflammatory changes. No significant stool burden. Vascular/Lymphatic: No atherosclerotic changes. No aneurysm. Proximal femoral arteries patent. No adenopathy. Reproductive: Unremarkable pelvic organs. Other: None Musculoskeletal: No acute displaced fracture. Scoliotic curvature again noted. No significant degenerative changes. IMPRESSION: No CT evidence of acute abnormality. Cholelithiasis without CT evidence of acute cholecystitis. Diverticular disease. Electronically Signed   By: Gilmer Mor D.O.   On: 11/20/2018 10:24   Mr 3d Recon At Scanner  Result Date: 11/21/2018 CLINICAL DATA:  Abdominal pain for several months.  Cholelithiasis. EXAM: MRI ABDOMEN WITHOUT AND WITH CONTRAST (INCLUDING MRCP) TECHNIQUE: Multiplanar multisequence MR imaging of the abdomen was performed both before and after the administration of intravenous contrast. Heavily T2-weighted images of the biliary and pancreatic ducts were obtained, and three-dimensional MRCP images were rendered by post processing. CONTRAST:  10 mL Gadavist COMPARISON:  CT on 11/20/2018 FINDINGS: Lower chest: No acute findings. Hepatobiliary: No hepatic masses identified. Numerous tiny gallstones are seen, however there  is no evidence of gallbladder wall thickening or pericholecystic inflammatory changes. Several small calculi are seen within the cystic duct. There is no evidence of biliary ductal dilatation which limits evaluation of the common bile duct, however there are several subtle filling defects in the distal common bile duct suspicious for choledocholithiasis. Pancreas: No mass or inflammatory changes. No evidence of pancreatic ductal dilatation. Spleen:  Within normal limits in size and appearance. Adrenals/Urinary Tract: No masses identified. No evidence of hydronephrosis. Stomach/Bowel: Visualized portion unremarkable. Vascular/Lymphatic: No pathologically enlarged lymph nodes identified. No abdominal aortic aneurysm. Other:  None. Musculoskeletal:  No suspicious bone lesions identified. IMPRESSION: Numerous tiny gallstones and several calculi seen in the cystic duct. No radiographic evidence of cholecystitis. No evidence of biliary ductal dilatation, however several tiny calculi are suspected in the distal common bile duct. Electronically Signed   By: Myles Rosenthal M.D.   On: 11/21/2018 08:31   Mr Abdomen Mrcp Vivien Rossetti Contast  Result Date: 11/21/2018 CLINICAL DATA:  Abdominal pain for several months.  Cholelithiasis. EXAM: MRI ABDOMEN WITHOUT AND WITH CONTRAST (INCLUDING MRCP) TECHNIQUE: Multiplanar multisequence MR imaging of the abdomen was performed both before and after the administration of intravenous contrast. Heavily T2-weighted images of the biliary and pancreatic ducts were obtained, and three-dimensional MRCP images were rendered by post processing. CONTRAST:  10 mL Gadavist COMPARISON:  CT on 11/20/2018 FINDINGS: Lower chest: No acute findings. Hepatobiliary: No hepatic masses identified. Numerous tiny gallstones are seen, however there is no evidence of gallbladder wall thickening or pericholecystic inflammatory changes. Several small calculi are seen within the cystic duct. There is no evidence of biliary  ductal dilatation which limits evaluation of the common bile duct, however there are several subtle filling defects in the distal common bile duct suspicious for choledocholithiasis. Pancreas: No mass or inflammatory  changes. No evidence of pancreatic ductal dilatation. Spleen:  Within normal limits in size and appearance. Adrenals/Urinary Tract: No masses identified. No evidence of hydronephrosis. Stomach/Bowel: Visualized portion unremarkable. Vascular/Lymphatic: No pathologically enlarged lymph nodes identified. No abdominal aortic aneurysm. Other:  None. Musculoskeletal:  No suspicious bone lesions identified. IMPRESSION: Numerous tiny gallstones and several calculi seen in the cystic duct. No radiographic evidence of cholecystitis. No evidence of biliary ductal dilatation, however several tiny calculi are suspected in the distal common bile duct. Electronically Signed   By: Myles Rosenthal M.D.   On: 11/21/2018 08:31   US Abdomen Limited Ruq  Result Date: 11/20/2018 CLINICAL DATA:  Cholelithiasis and upper abdominal pain EXAM: ULTRASOUND ABDOMEN LIMITED RIGHT UPPER QUADRANT COMPARISON:  CT from earlier in the same day. FINDINGS: Gallbladder: Gallbladder is well distended and demonstrates multiple gallstones. No pericholecystic fluid or wall thickening is noted. Negative sonographic Eulah Pont sign is elicited. Common bile duct: Diameter: 3.3 mm. Liver: No focal lesion identified. Within normal limits in parenchymal echogenicity. Portal vein is patent on color Doppler imaging with normal direction of blood flow towards the liver. IMPRESSION: Cholelithiasis without complicating factors. Electronically Signed   By: Alcide Clever M.D.   On: 11/20/2018 11:35     Labs:   Basic Metabolic Panel: Recent Labs  Lab 11/24/18 0517 11/25/18 0557 11/26/18 0505 11/27/18 0508 11/28/18 0452  NA 135 134* 133* 135 136  K 3.6 3.5 3.7 3.2* 3.6  CL 100 99 97* 99 102  CO2 24 25 26 26 27   GLUCOSE 101* 97 104* 116* 100*   BUN 8 7 9 13 14   CREATININE 0.74 0.81 0.84 0.65 0.76  CALCIUM 8.7* 8.7* 8.8* 8.7* 8.7*  MG 2.0  --  2.2 2.0  --    GFR Estimated Creatinine Clearance: 128.2 mL/min (by C-G formula based on SCr of 0.76 mg/dL). Liver Function Tests: Recent Labs  Lab 11/24/18 0517 11/25/18 0557 11/26/18 0505 11/27/18 0508 11/28/18 0452  AST 306* 429* 534* 646* 470*  ALT 523* 672* 787* 896* 841*  ALKPHOS 129* 146* 186* 186* 223*  BILITOT 5.8* 6.8* 3.7* 3.3* 1.7*  PROT 6.8 6.9 7.1 6.8 6.3*  ALBUMIN 3.4* 3.5 3.4* 3.3* 3.1*   Recent Labs  Lab 11/24/18 0517 11/25/18 0557 11/26/18 0505  LIPASE 383* 74* 42   No results for input(s): AMMONIA in the last 168 hours. Coagulation profile No results for input(s): INR, PROTIME in the last 168 hours.  CBC: Recent Labs  Lab 11/22/18 0529 11/23/18 2033 11/24/18 0517 11/26/18 0505  WBC 4.6 7.4 9.1 7.5  NEUTROABS  --  6.2  --  5.1  HGB 13.6 13.9 13.3 12.7*  HCT 44.2 43.5 42.2 40.3  MCV 80.5 78.8* 78.9* 78.7*  PLT 193 180 175 166   Cardiac Enzymes: No results for input(s): CKTOTAL, CKMB, CKMBINDEX, TROPONINI in the last 168 hours. BNP: Invalid input(s): POCBNP CBG: No results for input(s): GLUCAP in the last 168 hours. D-Dimer No results for input(s): DDIMER in the last 72 hours. Hgb A1c No results for input(s): HGBA1C in the last 72 hours. Lipid Profile No results for input(s): CHOL, HDL, LDLCALC, TRIG, CHOLHDL, LDLDIRECT in the last 72 hours. Thyroid function studies No results for input(s): TSH, T4TOTAL, T3FREE, THYROIDAB in the last 72 hours.  Invalid input(s): FREET3 Anemia work up Recent Labs    11/27/18 1400  TIBC 411  IRON 67   Microbiology Recent Results (from the past 240 hour(s))  Surgical pcr screen  Status: None   Collection Time: 11/23/18  6:43 AM  Result Value Ref Range Status   MRSA, PCR NEGATIVE NEGATIVE Final   Staphylococcus aureus NEGATIVE NEGATIVE Final    Comment: (NOTE) The Xpert SA Assay (FDA approved  for NASAL specimens in patients 48 years of age and older), is one component of a comprehensive surveillance program. It is not intended to diagnose infection nor to guide or monitor treatment. Performed at [facility], [address], [place], [place] [address]      Discharge Instructions:   Discharge Instructions    Call MD for:  persistant nausea and vomiting   Complete by:  As directed    Call MD for:  severe uncontrolled pain   Complete by:  As directed    Call MD for:  temperature >100.4   Complete by:  As directed    Diet - low sodium heart healthy   Complete by:  As directed    Discharge instructions   Complete by:  As directed    Follow-up with GI Dr Ewing Schlein in 1 week to follow up with your liver function. Follow up with surgery as scheduled on 12/08/18   Increase activity slowly   Complete by:  As directed      Allergies as of 11/28/2018      Reactions   Other Anaphylaxis   Nuts   Shellfish Allergy Anaphylaxis      Medication List    TAKE these medications   albuterol 108 (90 Base) MCG/ACT inhaler Commonly known as:  PROVENTIL HFA;VENTOLIN HFA Inhale 2 puffs into the lungs every 6 (six) hours as needed for wheezing or shortness of breath.   hydrocortisone 2.5 % rectal cream Commonly known as:  ANUSOL-HC Apply 1 application topically 4 (four) times daily as needed for hemorrhoids.   ibuprofen 600 MG tablet Commonly known as:  ADVIL,MOTRIN Take 1 tablet (600 mg total) by mouth every 8 (eight) hours as needed for mild pain.   loratadine 10 MG tablet Commonly known as:  CLARITIN Take 10 mg by mouth daily.   polyethylene glycol 17 g packet Commonly known as:  MIRALAX / GLYCOLAX Take 17 g by mouth daily as needed for up to 5 days for mild constipation or moderate constipation.   traMADol 50 MG tablet Commonly known as:  ULTRAM Take 1 tablet (50 mg total) by mouth every 6 (six) hours as needed for up to 5 days for moderate pain or severe  pain.      Follow-up Information    [facility] Follow up on 12/08/2018.   Specialty:  General Surgery Why:  9:45am, please make sure you have your phone with you at this time. If you have any questions or concerns about your incisions please email a picture of your incisions to [email or web address] prior to your appointment Contact information: [address] [place] [address] [phone]        Carman Ching, MD. Schedule an appointment as soon as possible for a visit in 1 week(s).   Specialty:  Gastroenterology Why:  to check liver function test and followup Contact information: [address] [place] [address] [phone]          Time coordinating discharge: 39 minutes  Signed:  Roselani Grajeda  [facility] 11/28/2018, 9:45 AM

## 2018-11-30 LAB — ANTINUCLEAR ANTIBODIES, IFA: ANA Ab, IFA: NEGATIVE

## 2018-12-08 DIAGNOSIS — R7989 Other specified abnormal findings of blood chemistry: Secondary | ICD-10-CM | POA: Diagnosis not present

## 2018-12-28 DIAGNOSIS — D508 Other iron deficiency anemias: Secondary | ICD-10-CM | POA: Diagnosis not present

## 2019-01-10 ENCOUNTER — Encounter (HOSPITAL_COMMUNITY): Payer: Self-pay

## 2019-01-10 ENCOUNTER — Other Ambulatory Visit: Payer: Self-pay

## 2019-01-10 ENCOUNTER — Emergency Department (HOSPITAL_COMMUNITY)
Admission: EM | Admit: 2019-01-10 | Discharge: 2019-01-11 | Disposition: A | Discharge status: Home / Self Care | Payer: BLUE CROSS/BLUE SHIELD | Source: Home / Self Care | Attending: Emergency Medicine | Admitting: Emergency Medicine

## 2019-01-10 DIAGNOSIS — J45909 Unspecified asthma, uncomplicated: Secondary | ICD-10-CM | POA: Insufficient documentation

## 2019-01-10 DIAGNOSIS — Z91018 Allergy to other foods: Secondary | ICD-10-CM | POA: Diagnosis not present

## 2019-01-10 DIAGNOSIS — R52 Pain, unspecified: Secondary | ICD-10-CM | POA: Diagnosis not present

## 2019-01-10 DIAGNOSIS — R231 Pallor: Secondary | ICD-10-CM | POA: Diagnosis not present

## 2019-01-10 DIAGNOSIS — R1033 Periumbilical pain: Secondary | ICD-10-CM | POA: Diagnosis not present

## 2019-01-10 DIAGNOSIS — R1011 Right upper quadrant pain: Secondary | ICD-10-CM | POA: Diagnosis not present

## 2019-01-10 DIAGNOSIS — R109 Unspecified abdominal pain: Secondary | ICD-10-CM

## 2019-01-10 DIAGNOSIS — Z1159 Encounter for screening for other viral diseases: Secondary | ICD-10-CM | POA: Diagnosis not present

## 2019-01-10 DIAGNOSIS — R7989 Other specified abnormal findings of blood chemistry: Secondary | ICD-10-CM | POA: Diagnosis not present

## 2019-01-10 DIAGNOSIS — K831 Obstruction of bile duct: Secondary | ICD-10-CM | POA: Diagnosis not present

## 2019-01-10 DIAGNOSIS — R748 Abnormal levels of other serum enzymes: Secondary | ICD-10-CM | POA: Diagnosis not present

## 2019-01-10 DIAGNOSIS — Z79891 Long term (current) use of opiate analgesic: Secondary | ICD-10-CM | POA: Diagnosis not present

## 2019-01-10 DIAGNOSIS — R740 Nonspecific elevation of levels of transaminase and lactic acid dehydrogenase [LDH]: Secondary | ICD-10-CM | POA: Diagnosis not present

## 2019-01-10 DIAGNOSIS — Z03818 Encounter for observation for suspected exposure to other biological agents ruled out: Secondary | ICD-10-CM | POA: Diagnosis not present

## 2019-01-10 DIAGNOSIS — R1084 Generalized abdominal pain: Secondary | ICD-10-CM | POA: Diagnosis not present

## 2019-01-10 DIAGNOSIS — R1010 Upper abdominal pain, unspecified: Secondary | ICD-10-CM | POA: Diagnosis not present

## 2019-01-10 DIAGNOSIS — K8050 Calculus of bile duct without cholangitis or cholecystitis without obstruction: Secondary | ICD-10-CM | POA: Diagnosis not present

## 2019-01-10 DIAGNOSIS — Z79899 Other long term (current) drug therapy: Secondary | ICD-10-CM | POA: Diagnosis not present

## 2019-01-10 DIAGNOSIS — Z9049 Acquired absence of other specified parts of digestive tract: Secondary | ICD-10-CM | POA: Diagnosis not present

## 2019-01-10 DIAGNOSIS — Z91013 Allergy to seafood: Secondary | ICD-10-CM | POA: Diagnosis not present

## 2019-01-10 DIAGNOSIS — R1110 Vomiting, unspecified: Secondary | ICD-10-CM | POA: Diagnosis not present

## 2019-01-10 DIAGNOSIS — E876 Hypokalemia: Secondary | ICD-10-CM | POA: Diagnosis not present

## 2019-01-10 DIAGNOSIS — R0689 Other abnormalities of breathing: Secondary | ICD-10-CM | POA: Diagnosis not present

## 2019-01-10 DIAGNOSIS — K8046 Calculus of bile duct with acute and chronic cholecystitis without obstruction: Secondary | ICD-10-CM | POA: Diagnosis not present

## 2019-01-10 DIAGNOSIS — R945 Abnormal results of liver function studies: Secondary | ICD-10-CM | POA: Diagnosis not present

## 2019-01-10 LAB — COMPREHENSIVE METABOLIC PANEL
ALT: 231 U/L — ABNORMAL HIGH (ref 0–44)
AST: 277 U/L — ABNORMAL HIGH (ref 15–41)
Albumin: 4.1 g/dL (ref 3.5–5.0)
Alkaline Phosphatase: 116 U/L (ref 38–126)
Anion gap: 7 (ref 5–15)
BUN: 9 mg/dL (ref 6–20)
CO2: 25 mmol/L (ref 22–32)
Calcium: 9 mg/dL (ref 8.9–10.3)
Chloride: 107 mmol/L (ref 98–111)
Creatinine, Ser: 0.85 mg/dL (ref 0.61–1.24)
GFR calc Af Amer: >60 mL/min (ref >60)
GFR calc non Af Amer: >60 mL/min (ref >60)
Glucose, Bld: 121 mg/dL — ABNORMAL HIGH (ref 70–99)
Potassium: 3.4 mmol/L — ABNORMAL LOW (ref 3.5–5.1)
Sodium: 139 mmol/L (ref 135–145)
Total Bilirubin: 1 mg/dL (ref 0.3–1.2)
Total Protein: 7.7 g/dL (ref 6.5–8.1)

## 2019-01-10 LAB — CBC
HCT: 42.3 % (ref 39.0–52.0)
Hemoglobin: 12.9 g/dL — ABNORMAL LOW (ref 13.0–17.0)
MCH: 25.1 pg — ABNORMAL LOW (ref 26.0–34.0)
MCHC: 30.5 g/dL (ref 30.0–36.0)
MCV: 82.3 fL (ref 80.0–100.0)
Platelets: 222 10*3/uL (ref 150–400)
RBC: 5.14 MIL/uL (ref 4.22–5.81)
RDW: 15.5 % (ref 11.5–15.5)
WBC: 10.2 10*3/uL (ref 4.0–10.5)
nRBC: 0 % (ref 0.0–0.2)

## 2019-01-10 LAB — URINALYSIS, ROUTINE W REFLEX MICROSCOPIC
Bilirubin Urine: NEGATIVE
Glucose, UA: NEGATIVE mg/dL
Hgb urine dipstick: NEGATIVE
Ketones, ur: NEGATIVE mg/dL
Leukocytes,Ua: NEGATIVE
Nitrite: NEGATIVE
Protein, ur: NEGATIVE mg/dL
Specific Gravity, Urine: 1.014 (ref 1.005–1.030)
pH: 8 (ref 5.0–8.0)

## 2019-01-10 LAB — LIPASE, BLOOD: Lipase: 42 U/L (ref 11–51)

## 2019-01-10 MED ORDER — SODIUM CHLORIDE 0.9 % IV BOLUS
1000.0000 mL | Freq: Once | INTRAVENOUS | Status: AC
  Administered 2019-01-10: 22:00:00 1000 mL via INTRAVENOUS

## 2019-01-10 MED ORDER — SODIUM CHLORIDE 0.9% FLUSH
3.0000 mL | Freq: Once | INTRAVENOUS | Status: AC
  Administered 2019-01-10: 3 mL via INTRAVENOUS

## 2019-01-10 NOTE — ED Provider Notes (Signed)
[facility] DEPT Provider Note   CSN: [id number] Arrival date & time: 01/10/19  1735    History   Chief Complaint Chief Complaint  Patient presents with  . Abdominal Pain    HPI Alejandro Willis is a 43 y.o. male who presents for evaluation of periumbilical abdominal pain that began about 4 PM this evening.  Patient reports he was at home when he started developing some pain to his mid and upper abdomen.  He states that he had some nausea and felt like he needed to go the bathroom.  He had 1 bowel movement states it was not diarrhea.  No blood noted in stool.  He did not have any vomiting.  Patient states he was concerned because pain progressively got worse, prompting EMS call.  On ED arrival, he states that pain has improved.  He states it is currently 1/10.  Patient states he was concerned because he recently got his gallbladder removed about a month ago.  He is not in complication since then.  He states that the pain felt similar to when he had gallstone issues previously.  Patient states that he was in his normal state of health today before onset of symptoms.  He reports that he ate at [employer] and ate some steak and tomatoes which he states he normally does not get.  He reports that nobody else in the family had the steak or tomatoes in their food.  Patient states he has not had any fevers.  Patient denies any chest pain, difficulty breathing, dysuria, hematuria, vomiting, fevers.  He denies any recent travel.  He denies any known COVID-19 exposure.     The history is provided by the patient.    History reviewed. No pertinent past medical history.  Patient Active Problem List   Diagnosis Date Noted  . Nausea without vomiting 11/25/2018  . Acute on chronic cholecystitis s/p lap cholecystectomy 11/23/2018 11/23/2018  . Asthma 11/23/2018  . Transaminitis 11/21/2018  . Elevated LFTs 11/20/2018  . Choledocholithiasis 11/20/2018    Past Surgical  History:  Procedure Laterality Date  . BILIARY DILATION  11/25/2018   Procedure: BILIARY DILATION;  Surgeon: Clarene Essex, MD;  Location: [facility];  Service: Endoscopy;;  . CHOLECYSTECTOMY N/A 11/23/2018   Procedure: LAPAROSCOPIC CHOLECYSTECTOMY WITH INTRAOPERATIVE CHOLANGIOGRAM;  Surgeon: Michael Boston, MD;  Location: [facility];  Service: General;  Laterality: N/A;  . ENDOSCOPIC RETROGRADE CHOLANGIOPANCREATOGRAPHY (ERCP) WITH PROPOFOL N/A 11/25/2018   Procedure: ENDOSCOPIC RETROGRADE CHOLANGIOPANCREATOGRAPHY (ERCP) WITH PROPOFOL;  Surgeon: Clarene Essex, MD;  Location: [facility];  Service: Endoscopy;  Laterality: N/A;  . ERCP N/A 11/23/2018   Procedure: ENDOSCOPIC RETROGRADE CHOLANGIOPANCREATOGRAPHY (ERCP);  Surgeon: Clarene Essex, MD;  Location: [facility];  Service: Endoscopy;  Laterality: N/A;  . PANCREATIC STENT PLACEMENT  11/23/2018   Procedure: PANCREATIC STENT PLACEMENT;  Surgeon: Michael Boston, MD;  Location: [facility];  Service: General;;  . REMOVAL OF STONES  11/23/2018   Procedure: REMOVAL OF STONES;  Surgeon: Michael Boston, MD;  Location: [facility];  Service: General;;  . REMOVAL OF STONES  11/25/2018   Procedure: REMOVAL OF STONES;  Surgeon: Clarene Essex, MD;  Location: [facility];  Service: Endoscopy;;  . Joan Mayans  11/23/2018   Procedure: Joan Mayans;  Surgeon: Michael Boston, MD;  Location: [facility];  Service: General;;  . SPHINCTEROTOMY  11/25/2018   Procedure: SPHINCTEROTOMY;  Surgeon: Clarene Essex, MD;  Location: [facility];  Service: Endoscopy;;  . STENT REMOVAL  11/25/2018   Procedure: STENT REMOVAL;  Surgeon: Clarene Essex, MD;  Location: [facility];  Service: Endoscopy;;        Home Medications    Prior to Admission medications   Medication Sig Start Date End Date Taking? Authorizing Provider  albuterol (PROVENTIL HFA;VENTOLIN HFA) 108 (90 Base) MCG/ACT inhaler Inhale 2 puffs into the lungs every 6 (six) hours as needed for wheezing or shortness of breath.  09/20/18  Yes [provider]  loratadine (CLARITIN) 10 MG tablet Take 10 mg by mouth daily.   Yes [provider]  hydrocortisone (ANUSOL-HC) 2.5 % rectal cream Apply 1 application topically 4 (four) times daily as needed for hemorrhoids. Patient not taking: Reported on 01/10/2019 11/28/18   Flora Lipps, MD  ibuprofen (ADVIL,MOTRIN) 600 MG tablet Take 1 tablet (600 mg total) by mouth every 8 (eight) hours as needed for mild pain. Patient not taking: Reported on 01/10/2019 11/28/18   Flora Lipps, MD    Family History No family history on file.  Social History Social History   Tobacco Use  . Smoking status: Never Smoker  . Smokeless tobacco: Never Used  Substance Use Topics  . Alcohol use: No  . Drug use: Yes    Frequency: 7.0 times per week    Types: Marijuana     Allergies   Other and Shellfish allergy   Review of Systems Review of Systems  Constitutional: Negative for fever.  Respiratory: Negative for cough and shortness of breath.   Cardiovascular: Negative for chest pain.  Gastrointestinal: Positive for abdominal pain. Negative for nausea and vomiting.  Genitourinary: Negative for dysuria and hematuria.  Neurological: Negative for headaches.  All other systems reviewed and are negative.    Physical Exam Updated Vital Signs BP 121/83 (BP Location: Left Arm)   Pulse 69   Temp 98.3 F (36.8 C)   Resp 16   Ht '5\' 6"'  (1.676 m)   Wt 90.7 kg   SpO2 100%   BMI 32.28 kg/m   Physical Exam Vitals signs and nursing note reviewed.  Constitutional:      Appearance: Normal appearance. He is well-developed.  HENT:     Head: Normocephalic and atraumatic.  Eyes:     General: Lids are normal.     Conjunctiva/sclera: Conjunctivae normal.     Pupils: Pupils are equal, round, and reactive to light.  Neck:     Musculoskeletal: Full passive range of motion without pain.  Cardiovascular:     Rate and Rhythm: Normal rate and regular rhythm.     Pulses: Normal pulses.           Radial pulses are 2+ on the right side and 2+ on the left side.       Dorsalis pedis pulses are 2+ on the right side and 2+ on the left side.     Heart sounds: Normal heart sounds. No murmur. No friction rub. No gallop.   Pulmonary:     Effort: Pulmonary effort is normal.     Breath sounds: Normal breath sounds.  Abdominal:     Palpations: Abdomen is soft. Abdomen is not rigid.     Tenderness: There is abdominal tenderness in the periumbilical area. There is no guarding. Negative signs include McBurney's sign.     Comments: Abdomen is soft, nondistended.  Mild tenderness noted to the periumbilical region.  No CVA tenderness bilaterally. No tenderness noted to McBurney's point.   Musculoskeletal: Normal range of motion.  Skin:    General: Skin is warm and dry.  Capillary Refill: Capillary refill takes less than 2 seconds.  Neurological:     Mental Status: He is alert and oriented to person, place, and time.  Psychiatric:        Speech: Speech normal.      ED Treatments / Results  Labs (all labs ordered are listed, but only abnormal results are displayed) Labs Reviewed  COMPREHENSIVE METABOLIC PANEL - Abnormal; Notable for the following components:      Result Value   Potassium 3.4 (*)    Glucose, Bld 121 (*)    AST 277 (*)    ALT 231 (*)    All other components within normal limits  CBC - Abnormal; Notable for the following components:   Hemoglobin 12.9 (*)    MCH 25.1 (*)    All other components within normal limits  LIPASE, BLOOD  URINALYSIS, ROUTINE W REFLEX MICROSCOPIC    EKG None  Radiology No results found.  Procedures Procedures (including critical care time)  Medications Ordered in ED Medications  sodium chloride flush (NS) 0.9 % injection 3 mL (3 mLs Intravenous Given 01/10/19 2203)  sodium chloride 0.9 % bolus 1,000 mL (0 mLs Intravenous Stopped 01/10/19 2343)     Initial Impression / Assessment and Plan / ED Course  I have reviewed the triage  vital signs and the nursing notes.  Pertinent labs & imaging results that were available during my care of the patient were reviewed by me and considered in my medical decision making (see chart for details).          43 year old male who presents for evaluation abdominal pain that began today.  States he was at home when he started experiencing abdominal pain.  No nausea vomiting/diarrhea.  No fevers.  Was in his normal state of health prior to onset of symptoms.  States pain is improved on ED arrival.  No chest pain, difficulty breathing.  Recent history of gallbladder removal about a month ago.  Had been doing well until today symptoms. Patient is afebrile, non-toxic appearing, sitting comfortably on examination table. Vital signs reviewed and stable.  Plan for labs, UA.  Consider viral GI process versus hepatobiliary etiology.  History/physical exam not concerning for appendicitis, diverticulitis, ACS etiology.  Lipase is unremarkable.  CBC shows no evidence of leukocytosis.  Hemoglobin is 12.9.  CMP shows potassium of 3.4, glucose of 121.  AST is elevated at 277 and ALT is elevated at 231.  Normal alk phos, total bili.  Review of records show that he has had previous transaminitis before.  His most recent LFTs were about a month ago which correlates to his gallbladder removal.  At that time, they were in the 400s/800s.  UA negative for any infectious etiology.  Reevaluation.  Vitals are stable.  Patient states he has not had any more pain since being here in ED.  Repeat abdominal exam is benign.  We will plan to p.o. challenge.  Reevaluation.  Patient has been eating here in the department for any vomiting.  He denies any pain.  Repeat abdominal exam is benign with no evidence of tenderness.  Vitals are stable.  At this time, exam is not concerning for any infectious etiology, appendicitis, perforation.  I discussed with patient regarding his elevated LFTs.  They have been elevated in the past  and he reports that GI is trending them.  I discussed with patient regarding staying away from Tylenol and alcohol.  Additionally, discussed with patient regarding following up with his GI.  Patient states he is ready to go home. At this time, patient exhibits no emergent life-threatening condition that require further evaluation in ED or admission. Patient had ample opportunity for questions and discussion. All patient's questions were answered with full understanding. Strict return precautions discussed. Patient expresses understanding and agreement to plan.   Portions of this note were generated with [employer]. Dictation errors may occur despite best attempts at proofreading.   Final Clinical Impressions(s) / ED Diagnoses   Final diagnoses:  Abdominal pain, unspecified abdominal location  Elevated LFTs    ED Discharge Orders    None       Volanda Napoleon, PA-C 01/11/19 0241    Daleen Bo, MD 01/11/19 [phone]

## 2019-01-10 NOTE — ED Triage Notes (Signed)
Pt BIBA from home. Pt had gallbladder removed 1 month ago. Pt is c/o mid upper abd pain. Pain increases with palpation. Denies N/V/D/constipation. Pt states pain is similar to stones prior to surgery. Pt describes pain as coming and going.

## 2019-01-11 NOTE — Discharge Instructions (Addendum)
Your liver enzymes were elevated today. They have been elevated in the past but they are improving.  You should limit your Tylenol and alcohol use.  Please call your GI doctor and touch base with them.  As we discussed, the rest of your work-up was reassuring.  Monitor your symptoms closely.  Return the emergency department for any worsening abdominal pain, vomiting or any other worsening or concerning symptoms.

## 2019-01-12 ENCOUNTER — Emergency Department (HOSPITAL_COMMUNITY): Payer: BLUE CROSS/BLUE SHIELD

## 2019-01-12 ENCOUNTER — Other Ambulatory Visit: Payer: Self-pay

## 2019-01-12 ENCOUNTER — Inpatient Hospital Stay (HOSPITAL_COMMUNITY)
Admission: EM | Admit: 2019-01-12 | Discharge: 2019-01-14 | DRG: 392 | Disposition: A | Discharge status: Home / Self Care | Payer: BLUE CROSS/BLUE SHIELD | Attending: Family Medicine | Admitting: Family Medicine

## 2019-01-12 ENCOUNTER — Encounter (HOSPITAL_COMMUNITY): Payer: Self-pay

## 2019-01-12 DIAGNOSIS — R1011 Right upper quadrant pain: Secondary | ICD-10-CM | POA: Diagnosis not present

## 2019-01-12 DIAGNOSIS — Z79899 Other long term (current) drug therapy: Secondary | ICD-10-CM | POA: Diagnosis not present

## 2019-01-12 DIAGNOSIS — R748 Abnormal levels of other serum enzymes: Secondary | ICD-10-CM | POA: Diagnosis present

## 2019-01-12 DIAGNOSIS — Z91018 Allergy to other foods: Secondary | ICD-10-CM

## 2019-01-12 DIAGNOSIS — Z91013 Allergy to seafood: Secondary | ICD-10-CM | POA: Diagnosis not present

## 2019-01-12 DIAGNOSIS — R7989 Other specified abnormal findings of blood chemistry: Secondary | ICD-10-CM | POA: Diagnosis present

## 2019-01-12 DIAGNOSIS — Z9049 Acquired absence of other specified parts of digestive tract: Secondary | ICD-10-CM

## 2019-01-12 DIAGNOSIS — R109 Unspecified abdominal pain: Secondary | ICD-10-CM

## 2019-01-12 DIAGNOSIS — R1110 Vomiting, unspecified: Secondary | ICD-10-CM | POA: Diagnosis not present

## 2019-01-12 DIAGNOSIS — E876 Hypokalemia: Secondary | ICD-10-CM | POA: Diagnosis present

## 2019-01-12 DIAGNOSIS — R1033 Periumbilical pain: Secondary | ICD-10-CM | POA: Diagnosis present

## 2019-01-12 DIAGNOSIS — Z1159 Encounter for screening for other viral diseases: Secondary | ICD-10-CM

## 2019-01-12 DIAGNOSIS — K8050 Calculus of bile duct without cholangitis or cholecystitis without obstruction: Secondary | ICD-10-CM | POA: Diagnosis present

## 2019-01-12 DIAGNOSIS — R740 Nonspecific elevation of levels of transaminase and lactic acid dehydrogenase [LDH]: Secondary | ICD-10-CM | POA: Diagnosis not present

## 2019-01-12 DIAGNOSIS — R945 Abnormal results of liver function studies: Secondary | ICD-10-CM | POA: Diagnosis not present

## 2019-01-12 DIAGNOSIS — K831 Obstruction of bile duct: Secondary | ICD-10-CM | POA: Diagnosis not present

## 2019-01-12 DIAGNOSIS — Z79891 Long term (current) use of opiate analgesic: Secondary | ICD-10-CM | POA: Diagnosis not present

## 2019-01-12 DIAGNOSIS — R1010 Upper abdominal pain, unspecified: Secondary | ICD-10-CM

## 2019-01-12 DIAGNOSIS — K8046 Calculus of bile duct with acute and chronic cholecystitis without obstruction: Secondary | ICD-10-CM | POA: Diagnosis not present

## 2019-01-12 DIAGNOSIS — R1084 Generalized abdominal pain: Secondary | ICD-10-CM | POA: Diagnosis not present

## 2019-01-12 LAB — COMPREHENSIVE METABOLIC PANEL
ALT: 530 U/L — ABNORMAL HIGH (ref 0–44)
AST: 384 U/L — ABNORMAL HIGH (ref 15–41)
Albumin: 4.7 g/dL (ref 3.5–5.0)
Alkaline Phosphatase: 161 U/L — ABNORMAL HIGH (ref 38–126)
Anion gap: 10 (ref 5–15)
BUN: 12 mg/dL (ref 6–20)
CO2: 24 mmol/L (ref 22–32)
Calcium: 9.3 mg/dL (ref 8.9–10.3)
Chloride: 105 mmol/L (ref 98–111)
Creatinine, Ser: 0.84 mg/dL (ref 0.61–1.24)
GFR calc Af Amer: >60 mL/min (ref >60)
GFR calc non Af Amer: >60 mL/min (ref >60)
Glucose, Bld: 150 mg/dL — ABNORMAL HIGH (ref 70–99)
Potassium: 3.2 mmol/L — ABNORMAL LOW (ref 3.5–5.1)
Sodium: 139 mmol/L (ref 135–145)
Total Bilirubin: 1.6 mg/dL — ABNORMAL HIGH (ref 0.3–1.2)
Total Protein: 8.5 g/dL — ABNORMAL HIGH (ref 6.5–8.1)

## 2019-01-12 LAB — CBC WITH DIFFERENTIAL/PLATELET
Abs Immature Granulocytes: 0.01 10*3/uL (ref 0.00–0.07)
Basophils Absolute: 0.1 10*3/uL (ref 0.0–0.1)
Basophils Relative: 1 %
Eosinophils Absolute: 0.2 10*3/uL (ref 0.0–0.5)
Eosinophils Relative: 3 %
HCT: 45.7 % (ref 39.0–52.0)
Hemoglobin: 13.9 g/dL (ref 13.0–17.0)
Immature Granulocytes: 0 %
Lymphocytes Relative: 22 %
Lymphs Abs: 1.6 10*3/uL (ref 0.7–4.0)
MCH: 24.5 pg — ABNORMAL LOW (ref 26.0–34.0)
MCHC: 30.4 g/dL (ref 30.0–36.0)
MCV: 80.6 fL (ref 80.0–100.0)
Monocytes Absolute: 0.7 10*3/uL (ref 0.1–1.0)
Monocytes Relative: 9 %
Neutro Abs: 4.8 10*3/uL (ref 1.7–7.7)
Neutrophils Relative %: 65 %
Platelets: 246 10*3/uL (ref 150–400)
RBC: 5.67 MIL/uL (ref 4.22–5.81)
RDW: 15.6 % — ABNORMAL HIGH (ref 11.5–15.5)
WBC: 7.4 10*3/uL (ref 4.0–10.5)
nRBC: 0 % (ref 0.0–0.2)

## 2019-01-12 LAB — LIPASE, BLOOD: Lipase: 27 U/L (ref 11–51)

## 2019-01-12 LAB — SARS CORONAVIRUS 2 BY RT PCR (HOSPITAL ORDER, PERFORMED IN ~~LOC~~ HOSPITAL LAB): SARS Coronavirus 2: NEGATIVE

## 2019-01-12 MED ORDER — SODIUM CHLORIDE 0.9 % IV SOLN
80.0000 mg | Freq: Two times a day (BID) | INTRAVENOUS | Status: DC
  Administered 2019-01-13 – 2019-01-14 (×3): 80 mg via INTRAVENOUS
  Filled 2019-01-12 (×4): qty 80

## 2019-01-12 MED ORDER — SODIUM CHLORIDE 0.9 % IV SOLN
2.0000 g | INTRAVENOUS | Status: DC
  Administered 2019-01-12 – 2019-01-13 (×2): 2 g via INTRAVENOUS
  Filled 2019-01-12: qty 20
  Filled 2019-01-12 (×2): qty 2

## 2019-01-12 MED ORDER — ONDANSETRON HCL 4 MG/2ML IJ SOLN
4.0000 mg | Freq: Once | INTRAMUSCULAR | Status: AC
  Administered 2019-01-12: 4 mg via INTRAVENOUS
  Filled 2019-01-12: qty 2

## 2019-01-12 MED ORDER — CEFTRIAXONE SODIUM 1 G IJ SOLR: 1.0000 g | INTRAMUSCULAR | Status: DC

## 2019-01-12 MED ORDER — HYDROMORPHONE HCL 1 MG/ML IJ SOLN
1.0000 mg | Freq: Once | INTRAMUSCULAR | Status: AC
  Administered 2019-01-12: 15:00:00 1 mg via INTRAVENOUS
  Filled 2019-01-12: qty 1

## 2019-01-12 MED ORDER — POTASSIUM CHLORIDE IN NACL 40-0.9 MEQ/L-% IV SOLN
INTRAVENOUS | Status: DC
  Administered 2019-01-12 – 2019-01-14 (×3): 100 mL/h via INTRAVENOUS
  Filled 2019-01-12 (×5): qty 1000

## 2019-01-12 MED ORDER — HYDROMORPHONE HCL 1 MG/ML IJ SOLN
1.0000 mg | Freq: Once | INTRAMUSCULAR | Status: AC
  Administered 2019-01-12: 1 mg via INTRAVENOUS
  Filled 2019-01-12: qty 1

## 2019-01-12 MED ORDER — HYDROMORPHONE HCL 1 MG/ML IJ SOLN
1.0000 mg | INTRAMUSCULAR | Status: DC | PRN
  Administered 2019-01-12: 1 mg via INTRAVENOUS
  Filled 2019-01-12: qty 1

## 2019-01-12 MED ORDER — IOHEXOL 300 MG/ML  SOLN
100.0000 mL | Freq: Once | INTRAMUSCULAR | Status: AC | PRN
  Administered 2019-01-12: 14:00:00 100 mL via INTRAVENOUS

## 2019-01-12 MED ORDER — MORPHINE SULFATE (PF) 2 MG/ML IV SOLN
2.0000 mg | INTRAVENOUS | Status: DC | PRN
  Administered 2019-01-12: 18:00:00 2 mg via INTRAVENOUS
  Filled 2019-01-12: qty 1

## 2019-01-12 MED ORDER — IBUPROFEN 800 MG PO TABS: 800.0000 mg | ORAL_TABLET | Freq: Four times a day (QID) | ORAL | Status: DC | PRN

## 2019-01-12 MED ORDER — SODIUM CHLORIDE 0.9 % IV SOLN
80.0000 mg | Freq: Once | INTRAVENOUS | Status: AC
  Administered 2019-01-12: 80 mg via INTRAVENOUS
  Filled 2019-01-12: qty 80

## 2019-01-12 MED ORDER — SODIUM CHLORIDE (PF) 0.9 % IJ SOLN
INTRAMUSCULAR | Status: AC
  Filled 2019-01-12: qty 50

## 2019-01-12 MED ORDER — HYDROMORPHONE HCL 1 MG/ML IJ SOLN
1.0000 mg | Freq: Once | INTRAMUSCULAR | Status: AC
  Administered 2019-01-12: 11:00:00 1 mg via INTRAVENOUS
  Filled 2019-01-12: qty 1

## 2019-01-12 MED ORDER — SODIUM CHLORIDE 0.9 % IV BOLUS
1000.0000 mL | Freq: Once | INTRAVENOUS | Status: AC
  Administered 2019-01-12: 1000 mL via INTRAVENOUS

## 2019-01-12 MED ORDER — ONDANSETRON HCL 4 MG PO TABS: 4.0000 mg | ORAL_TABLET | Freq: Four times a day (QID) | ORAL | Status: DC | PRN

## 2019-01-12 MED ORDER — SODIUM CHLORIDE 0.9 % IV SOLN: INTRAVENOUS | Status: DC

## 2019-01-12 MED ORDER — ONDANSETRON HCL 4 MG/2ML IJ SOLN
4.0000 mg | Freq: Four times a day (QID) | INTRAMUSCULAR | Status: DC | PRN
  Administered 2019-01-12: 4 mg via INTRAVENOUS
  Filled 2019-01-12: qty 2

## 2019-01-12 NOTE — ED Notes (Signed)
Patient transported to CT 

## 2019-01-12 NOTE — Progress Notes (Signed)
Pt had 2 episodes of emesis this evening since 1900. PRN zofran given the second time (pt didn't want it the first time-- felt like it would subside on its own). Will continue to monitor and reassess as needed. Pt will be NPO at 0001.

## 2019-01-12 NOTE — ED Notes (Signed)
Transport called to take patient to 5E. 

## 2019-01-12 NOTE — ED Notes (Signed)
Hospitalist at bedside 

## 2019-01-12 NOTE — ED Triage Notes (Signed)
Pt from home.  Pt c/o of abdominal pain that began last night, but was much worse this morning.  It woke him up at 5 am today.  Pt seen for the same on Sunday, but this time he has vomiting.  Pt had gall bladder surgery in April.  Pt's surgeon thinks a duct is blocked in his gallbladder.

## 2019-01-12 NOTE — Progress Notes (Addendum)
RN got report  From ED at 1624. Patient was transferred to 5 E at 1655. Alert and oriented x 4. Pain complained at 8. Vital signs was taken. Call light was within patient's reach. Waiting for new order.  SARS coronavirus was done on 01/12/2019 = negative

## 2019-01-12 NOTE — ED Notes (Signed)
US at bedside

## 2019-01-12 NOTE — Consult Note (Signed)
[facility] CONSULT Reason for consult: Abdominal pain and elevated liver test Referring Physician: [facility] hospitalist.  PCP: None.  Primary GI: Dr. Ewing Schlein  Alejandro Willis is an 43 y.o. male.  HPI: Very nice 43 year old male who is hospitalized early April.  He had marked elevated liver test and gallstones with probable CBD stones.  This led to ERCP with Precut sphincterotomy and multiple stones in the CBD.  The end of the procedure the balloon pull-through was negative.  This was performed by Dr. Ewing Schlein.  He had to have 2 procedures 2 days apart due to recurrence of pain and had stones removed from both procedures.  This was followed by cholecystectomy with IOC with no clear stone seen in the CBD.  Contrast did not go into the duodenum.  Patient was seen back in our office and LFTs in our office several weeks ago were nearly back to normal.  Patient reports that he felt well had minimal if any pain and was eating pretty much what he wanted to until this past Saturday when he began to have upper pain very similar to his previous gallbladder pain after eating a steak and salsa.  And came to the emergency room 2 days ago liver test had increased from our office numbers.  WBC was normal.  He clinically felt better after fluids and some pain medicine and went home.  Yesterday he felt much better and ate a chicken dish and other food.  Within an hour or so after eating this he began to have gradual onset of pain that ultimately brought him back to the emergency room.  He describes the pain is diffuse across his entire upper abdomen with nausea and vomiting.  CT scan showed mild dilatation nonspecific with no signs of a bile leak, abscess or pancreatitis.  Patient did have a somewhat dilated stomach.  Ultrasound was obtained and was nonrevealing.  His labs revealed a normal WBC but marked elevation of AST and ALT with slight elevation of alkaline phosphatase total bilirubin of more elevated than 2 days  ago.  Again the patient noted that he was completely asymptomatic up until about 3 days ago and has had.  During the past 3 days when he felt much better and was able to eat.  No vomiting of blood.  He has had some heartburn since he started vomiting.  History reviewed. No pertinent past medical history.  Past Surgical History:  Procedure Laterality Date  . BILIARY DILATION  11/25/2018   Procedure: BILIARY DILATION;  Surgeon: Vida Rigger, MD;  Location: [facility];  Service: Endoscopy;;  . CHOLECYSTECTOMY N/A 11/23/2018   Procedure: LAPAROSCOPIC CHOLECYSTECTOMY WITH INTRAOPERATIVE CHOLANGIOGRAM;  Surgeon: Karie Soda, MD;  Location: [facility];  Service: General;  Laterality: N/A;  . ENDOSCOPIC RETROGRADE CHOLANGIOPANCREATOGRAPHY (ERCP) WITH PROPOFOL N/A 11/25/2018   Procedure: ENDOSCOPIC RETROGRADE CHOLANGIOPANCREATOGRAPHY (ERCP) WITH PROPOFOL;  Surgeon: Vida Rigger, MD;  Location: [facility];  Service: Endoscopy;  Laterality: N/A;  . ERCP N/A 11/23/2018   Procedure: ENDOSCOPIC RETROGRADE CHOLANGIOPANCREATOGRAPHY (ERCP);  Surgeon: Vida Rigger, MD;  Location: [facility];  Service: Endoscopy;  Laterality: N/A;  . PANCREATIC STENT PLACEMENT  11/23/2018   Procedure: PANCREATIC STENT PLACEMENT;  Surgeon: Karie Soda, MD;  Location: [facility];  Service: General;;  . REMOVAL OF STONES  11/23/2018   Procedure: REMOVAL OF STONES;  Surgeon: Karie Soda, MD;  Location: [facility];  Service: General;;  . REMOVAL OF STONES  11/25/2018   Procedure: REMOVAL OF STONES;  Surgeon: Vida Rigger, MD;  Location: [facility];  Service: Endoscopy;;  . SPHINCTEROTOMY  11/23/2018   Procedure: SPHINCTEROTOMY;  Surgeon: Karie Soda, MD;  Location: [facility];  Service: General;;  . SPHINCTEROTOMY  11/25/2018   Procedure: Dennison Mascot;  Surgeon: Vida Rigger, MD;  Location: [facility];  Service: Endoscopy;;  . STENT REMOVAL  11/25/2018   Procedure: STENT REMOVAL;  Surgeon: Vida Rigger, MD;  Location: [facility];  Service: Endoscopy;;     Family History  Problem Relation Age of Onset  . Asthma Sister     Social History:  reports that he has never smoked. He has never used smokeless tobacco. He reports current drug use. Frequency: 7.00 times per week. Drug: Marijuana. He reports that he does not drink alcohol.  Allergies:  Allergies  Allergen Reactions  . Other Anaphylaxis    Nuts  . Shellfish Allergy Anaphylaxis    Medications; Prior to Admission medications   Medication Sig Start Date End Date Taking? Authorizing Provider  albuterol (PROVENTIL HFA;VENTOLIN HFA) 108 (90 Base) MCG/ACT inhaler Inhale 2 puffs into the lungs every 6 (six) hours as needed for wheezing or shortness of breath.  09/20/18  Yes [provider]  ferrous sulfate 325 (65 FE) MG tablet Take 325 mg by mouth daily.   Yes [provider]  loratadine (CLARITIN) 10 MG tablet Take 10 mg by mouth daily as needed for allergies.    Yes [provider]  traMADol (ULTRAM) 50 MG tablet Take 50 mg by mouth every 6 (six) hours as needed for moderate pain.   Yes [provider]   . sodium chloride (PF)       PRN Meds HYDROmorphone (DILAUDID) injection, ibuprofen, morphine injection, ondansetron **OR** ondansetron (ZOFRAN) IV Results for orders placed or performed during the hospital encounter of 01/12/19 (from the past 48 hour(s))  CBC with Differential/Platelet     Status: Abnormal   Collection Time: 01/12/19  9:23 AM  Result Value Ref Range   WBC 7.4 4.0 - 10.5 K/uL   RBC 5.67 4.22 - 5.81 MIL/uL   Hemoglobin 13.9 13.0 - 17.0 g/dL   HCT 16.1 09.6 - 04.5 %   MCV 80.6 80.0 - 100.0 fL   MCH 24.5 (L) 26.0 - 34.0 pg   MCHC 30.4 30.0 - 36.0 g/dL   RDW 40.9 (H) 81.1 - 91.4 %   Platelets 246 150 - 400 K/uL   nRBC 0.0 0.0 - 0.2 %   Neutrophils Relative % 65 %   Neutro Abs 4.8 1.7 - 7.7 K/uL   Lymphocytes Relative 22 %   Lymphs Abs 1.6 0.7 - 4.0 K/uL   Monocytes Relative 9 %   Monocytes Absolute 0.7 0.1 - 1.0 K/uL    Eosinophils Relative 3 %   Eosinophils Absolute 0.2 0.0 - 0.5 K/uL   Basophils Relative 1 %   Basophils Absolute 0.1 0.0 - 0.1 K/uL   WBC Morphology VACUOLATED NEUTROPHILS    Immature Granulocytes 0 %   Abs Immature Granulocytes 0.01 0.00 - 0.07 K/uL    Comment: Performed at [facility], [address], [place], [place] [address]  Comprehensive metabolic panel     Status: Abnormal   Collection Time: 01/12/19  9:23 AM  Result Value Ref Range   Sodium 139 135 - 145 mmol/L   Potassium 3.2 (L) 3.5 - 5.1 mmol/L   Chloride 105 98 - 111 mmol/L   CO2 24 22 - 32 mmol/L   Glucose, Bld 150 (H) 70 - 99 mg/dL  BUN 12 6 - 20 mg/dL   Creatinine, Ser 9.62 0.61 - 1.24 mg/dL   Calcium 9.3 8.9 - 95.2 mg/dL   Total Protein 8.5 (H) 6.5 - 8.1 g/dL   Albumin 4.7 3.5 - 5.0 g/dL   AST 841 (H) 15 - 41 U/L   ALT 530 (H) 0 - 44 U/L   Alkaline Phosphatase 161 (H) 38 - 126 U/L   Total Bilirubin 1.6 (H) 0.3 - 1.2 mg/dL   GFR calc non Af Amer >60 >60 mL/min   GFR calc Af Amer >60 >60 mL/min   Anion gap 10 5 - 15    Comment: Performed at [facility], [address] [place], [place] [address]  Lipase, blood     Status: None   Collection Time: 01/12/19  9:23 AM  Result Value Ref Range   Lipase 27 11 - 51 U/L    Comment: Performed at [facility], [address], [place], [place] [address]  SARS Coronavirus 2 (CEPHEID - Performed in [facility] hospital lab), Hosp Order     Status: None   Collection Time: 01/12/19 10:39 AM  Result Value Ref Range   SARS Coronavirus 2 NEGATIVE NEGATIVE    Comment: (NOTE) If result is NEGATIVE SARS-CoV-2 target nucleic acids are NOT DETECTED. The SARS-CoV-2 RNA is generally detectable in upper and lower  respiratory specimens during the acute phase of infection. The lowest  concentration of SARS-CoV-2 viral copies this assay can detect is 250  copies / mL. A negative result does not preclude SARS-CoV-2 infection   and should not be used as the sole basis for treatment or other  patient management decisions.  A negative result may occur with  improper specimen collection / handling, submission of specimen other  than nasopharyngeal swab, presence of viral mutation(s) within the  areas targeted by this assay, and inadequate number of viral copies  (<250 copies / mL). A negative result must be combined with clinical  observations, patient history, and epidemiological information. If result is POSITIVE SARS-CoV-2 target nucleic acids are DETECTED. The SARS-CoV-2 RNA is generally detectable in upper and lower  respiratory specimens dur ing the acute phase of infection.  Positive  results are indicative of active infection with SARS-CoV-2.  Clinical  correlation with patient history and other diagnostic information is  necessary to determine patient infection status.  Positive results do  not rule out bacterial infection or co-infection with other viruses. If result is PRESUMPTIVE POSTIVE SARS-CoV-2 nucleic acids MAY BE PRESENT.   A presumptive positive result was obtained on the submitted specimen  and confirmed on repeat testing.  While 2019 novel coronavirus  (SARS-CoV-2) nucleic acids may be present in the submitted sample  additional confirmatory testing may be necessary for epidemiological  and / or clinical management purposes  to differentiate between  SARS-CoV-2 and other Sarbecovirus currently known to infect humans.  If clinically indicated additional testing with an alternate test  methodology [phone]) is advised. The SARS-CoV-2 RNA is generally  detectable in upper and lower respiratory sp ecimens during the acute  phase of infection. The expected result is Negative. Fact Sheet for Patients:  BoilerBrush.com.cy Fact Sheet for Healthcare Providers: https://pope.com/ This test is not yet approved or cleared by the [place] FDA  and has been authorized for detection and/or diagnosis of SARS-CoV-2 by FDA under an Emergency Use Authorization (EUA).  This EUA will remain in effect (meaning this test can be used) for the duration of  the COVID-19 declaration under Section 564(b)(1) of the Act, 21 U.S.C. section 360bbb-3(b)(1), unless the authorization is terminated or revoked sooner. Performed at [facility], [address] [place], [place] [address]     Ct Abdomen Pelvis W Contrast  Result Date: 01/12/2019 CLINICAL DATA:  Abdominal pain and vomiting. Prior cholecystectomy and ERCP in April. EXAM: CT ABDOMEN AND PELVIS WITH CONTRAST TECHNIQUE: Multidetector CT imaging of the abdomen and pelvis was performed using the standard protocol following bolus administration of intravenous contrast. CONTRAST:  OMNIPAQUE IOHEXOL 300 MG/ML  SOLN COMPARISON:  Abdominal ultrasound from same day. MRCP and CT abdomen pelvis dated November 20, 2018. FINDINGS: Lower chest: No acute abnormality. Bibasilar subsegmental atelectasis. Hepatobiliary: No focal liver abnormality. Status post cholecystectomy. Mild central intrahepatic biliary dilatation. The common bile duct is normal in caliber. Pancreas: Unremarkable. No pancreatic ductal dilatation or surrounding inflammatory changes. Spleen: Normal in size without focal abnormality. Adrenals/Urinary Tract: Adrenal glands are unremarkable. Kidneys are normal, without renal calculi, focal lesion, or hydronephrosis. Bladder is unremarkable. Stomach/Bowel: Stomach is within normal limits. Appendix appears normal. No evidence of bowel wall thickening, distention, or inflammatory changes. Vascular/Lymphatic: No significant vascular findings are present. No enlarged abdominal or pelvic lymph nodes. Reproductive: Prostate is unremarkable. Other: No abdominal wall hernia or abnormality. No abdominopelvic ascites. No pneumoperitoneum. Musculoskeletal: No acute or significant osseous  findings. IMPRESSION: 1. Mild central intrahepatic biliary dilatation is nonspecific but could be related to post cholecystectomy state. The common bile duct is within normal limits. 2. Otherwise unremarkable CT of the abdomen and pelvis. Electronically Signed   By: Obie Dredge M.D.   On: 01/12/2019 14:15   US Abdomen Limited Ruq  Result Date: 01/12/2019 CLINICAL DATA:  Constant upper abdominal pain since vomiting 6 days ago. Elevated liver function studies. Cholecystectomy 11/23/2018 with sphincterotomy 11/25/2018. EXAM: ULTRASOUND ABDOMEN LIMITED RIGHT UPPER QUADRANT COMPARISON:  None. FINDINGS: Gallbladder: Surgically absent.  No focal right upper quadrant abdominal pain. Common bile duct: Diameter: 6 mm.  No evidence of choledocholithiasis. Liver: No focal lesion identified. Within normal limits in parenchymal echogenicity. Portal vein is patent on color Doppler imaging with normal direction of blood flow towards the liver. IMPRESSION: No acute findings or biliary dilatation identified post cholecystectomy. Electronically Signed   By: Carey Bullocks M.D.   On: 01/12/2019 11:59              Blood pressure 138/88, pulse 87, temperature 97.7 F (36.5 C), temperature source Axillary, resp. rate 20, SpO2 92 %.  Physical exam:   General--Pleasant alert male. ENT--nonicteric Neck--supple with full range of motion Heart--normal S1 and S2 Lungs--clear Abdomen--distended with few bowel sounds and marked tenderness of the upper abdomen Psych--alert and gives a good history somewhat sedated due to pain medications.   Assessment: 1.  Abdominal pain elevated LFTs.  This is most consistent with retained CBD stones.  He has had several episodes where he felt well for long periods after his cholecystectomy and previous ERCPs sudden onset of severe pain after eating.  Given his rising LFTs the past several days feel that he will need ERCP to clear the bile duct, extend the sphincterotomy if  needed placement of stents etc.  This is been discussed with him several times and we did discuss it with him again.  His WBC is normal and he is afebrile we will go ahead and empirically start him on antibiotics.  He denies any allergies to antibiotics. 2.  Hypokalemia.  Plan: We will  go ahead and plan elective ERCP, sphincterotomy and stone extraction tomorrow by Dr. Ewing SchleinMagod and repeat liver test.  I will start him empirically on Rocephin.  We will allow ice chips tonight and n.p.o. tomorrow since we do not know the exact time of his procedure.  He asked questions and these were answered.  He is quite familiar with ERCP but we did go over this again.   Alejandro Willis 01/12/2019, 5:15 PM   This note was created using voice recognition software and minor errors may Have occurred unintentionally. Pager: [phone] If no answer or after hours call [phone]

## 2019-01-12 NOTE — ED Notes (Signed)
Patient provided with mouth swabs. 

## 2019-01-12 NOTE — H&P (Signed)
History and Physical    Alejandro Willis WUJ:[id number] DOB: 1976-07-04 DOA: 01/12/2019  PCP: Patient, No Pcp Per  Patient coming from: Home  Chief Complaint: Abdominal pain   HPI: Alejandro Willis is a 43 y.o. male with medical history significant of choledocholithiasis who now presents with recurrent abdominal pain. He is status post ERCP with removal of CBD stones and stent placement on 11/23/2018 but with trending up of LFTs. Status post laparoscopic cholecystectomy with intraoperative cholangiogram on 11/23/2018 by general surgery. Patient underwent ERCP with sphincterectomy repeated on 11/25/18 due to rising LFTs. CBD stones were then cleared at that time.   He was again evaluated in the emergency department on 5/24 for periumbilical abdominal pain.  At that time, his liver enzymes were elevated.  His abdominal pain improved while in the emergency department and was discharged home.  He now returns with recurrent abdominal pain which started last night around 9 PM.  He states that he had chicken nuggets and [place] fries last night, started having some vague abdominal pain around 11 PM.  He then woke up with severe abdominal pain around 5 AM this morning associated with nausea and vomiting.  He denies any fevers, chills, night sweats, chest pain, shortness of breath or cough.  No diarrhea.  His abdominal pain is epigastric and right upper quadrant, crampy in nature which waxes and wanes.  ED Course: K 3.2, LFT elevated AST 384, ALT 530, Total bili 1.6. CT A/P Mild central intrahepatic biliary dilatation is nonspecific but could be related to post cholecystectomy state. The common bile duct is within normal limits. Otherwise unremarkable CT of the abdomen and pelvis. RUQ US: No acute findings or biliary dilatation identified post cholecystectomy. GI consulted, recommending symptom control, high dose PPI for now. SARS-CoV-2 negative.   Review of Systems: As per HPI otherwise 10 point review of  systems negative.   History reviewed. No pertinent past medical history.  Past Surgical History:  Procedure Laterality Date  . BILIARY DILATION  11/25/2018   Procedure: BILIARY DILATION;  Surgeon: Vida RiggerMagod, Marc, MD;  Location: [facility];  Service: Endoscopy;;  . CHOLECYSTECTOMY N/A 11/23/2018   Procedure: LAPAROSCOPIC CHOLECYSTECTOMY WITH INTRAOPERATIVE CHOLANGIOGRAM;  Surgeon: Karie SodaGross, Steven, MD;  Location: [facility];  Service: General;  Laterality: N/A;  . ENDOSCOPIC RETROGRADE CHOLANGIOPANCREATOGRAPHY (ERCP) WITH PROPOFOL N/A 11/25/2018   Procedure: ENDOSCOPIC RETROGRADE CHOLANGIOPANCREATOGRAPHY (ERCP) WITH PROPOFOL;  Surgeon: Vida RiggerMagod, Marc, MD;  Location: [facility];  Service: Endoscopy;  Laterality: N/A;  . ERCP N/A 11/23/2018   Procedure: ENDOSCOPIC RETROGRADE CHOLANGIOPANCREATOGRAPHY (ERCP);  Surgeon: Vida RiggerMagod, Marc, MD;  Location: [facility];  Service: Endoscopy;  Laterality: N/A;  . PANCREATIC STENT PLACEMENT  11/23/2018   Procedure: PANCREATIC STENT PLACEMENT;  Surgeon: Karie SodaGross, Steven, MD;  Location: [facility];  Service: General;;  . REMOVAL OF STONES  11/23/2018   Procedure: REMOVAL OF STONES;  Surgeon: Karie SodaGross, Steven, MD;  Location: [facility];  Service: General;;  . REMOVAL OF STONES  11/25/2018   Procedure: REMOVAL OF STONES;  Surgeon: Vida RiggerMagod, Marc, MD;  Location: [facility];  Service: Endoscopy;;  . Dennison MascotSPHINCTEROTOMY  11/23/2018   Procedure: Dennison MascotSPHINCTEROTOMY;  Surgeon: Karie SodaGross, Steven, MD;  Location: [facility];  Service: General;;  . SPHINCTEROTOMY  11/25/2018   Procedure: SPHINCTEROTOMY;  Surgeon: Vida RiggerMagod, Marc, MD;  Location: [facility];  Service: Endoscopy;;  . STENT REMOVAL  11/25/2018   Procedure: STENT REMOVAL;  Surgeon: Vida RiggerMagod, Marc, MD;  Location: [facility];  Service: Endoscopy;;     reports that he has never smoked.  He has never used smokeless tobacco. He reports current drug use. Frequency: 7.00 times per week. Drug: Marijuana. He reports that he does not drink alcohol.  Allergies  Allergen Reactions  .  Other Anaphylaxis    Nuts  . Shellfish Allergy Anaphylaxis   Family History  Problem Relation Age of Onset  . Asthma Sister     Prior to Admission medications   Medication Sig Start Date End Date Taking? Authorizing Provider  albuterol (PROVENTIL HFA;VENTOLIN HFA) 108 (90 Base) MCG/ACT inhaler Inhale 2 puffs into the lungs every 6 (six) hours as needed for wheezing or shortness of breath.  09/20/18  Yes [provider]  ferrous sulfate 325 (65 FE) MG tablet Take 325 mg by mouth daily.   Yes [provider]  loratadine (CLARITIN) 10 MG tablet Take 10 mg by mouth daily as needed for allergies.    Yes [provider]  traMADol (ULTRAM) 50 MG tablet Take 50 mg by mouth every 6 (six) hours as needed for moderate pain.   Yes [provider]  hydrocortisone (ANUSOL-HC) 2.5 % rectal cream Apply 1 application topically 4 (four) times daily as needed for hemorrhoids. Patient not taking: Reported on 01/10/2019 11/28/18   Joycelyn Das, MD  ibuprofen (ADVIL,MOTRIN) 600 MG tablet Take 1 tablet (600 mg total) by mouth every 8 (eight) hours as needed for mild pain. Patient not taking: Reported on 01/10/2019 11/28/18   Joycelyn Das, MD    Physical Exam: Vitals:   01/12/19 1400 01/12/19 1415 01/12/19 1430 01/12/19 1445  BP: (!) 158/94 (!) 165/95 137/81 (!) 176/98  Pulse: 93 99 91 93  Resp:   20 19  Temp:      TempSrc:      SpO2: 96% 95% 95% 96%     Constitutional: NAD, calm, uncomfortable Eyes: PERRL, lids and conjunctivae normal ENMT: Mucous membranes are moist. Posterior pharynx clear of any exudate or lesions.Normal dentition.  Neck: normal, supple, no masses, no thyromegaly Respiratory: clear to auscultation bilaterally, no wheezing, no crackles. Normal respiratory effort. No accessory muscle use.  Cardiovascular: Regular rate and rhythm, no murmurs / rubs / gallops. No extremity edema.  Abdomen: Mildly distended, tender to palpation epigastric and right  upper quadrant Musculoskeletal: no clubbing / cyanosis. No joint deformity upper and lower extremities. Good ROM, no contractures. Normal muscle tone.  Skin: no rashes, lesions, ulcers Neurologic: CN 2-12 grossly intact. Strength 5/5 in all 4.  Psychiatric: Normal judgment and insight. Alert and oriented x 3. Normal mood.   Labs on Admission: I have personally reviewed following labs and imaging studies  CBC: Recent Labs  Lab 01/10/19 1744 01/12/19 0923  WBC 10.2 7.4  NEUTROABS  --  4.8  HGB 12.9* 13.9  HCT 42.3 45.7  MCV 82.3 80.6  PLT 222 246   Basic Metabolic Panel: Recent Labs  Lab 01/10/19 1744 01/12/19 0923  NA 139 139  K 3.4* 3.2*  CL 107 105  CO2 25 24  GLUCOSE 121* 150*  BUN 9 12  CREATININE 0.85 0.84  CALCIUM 9.0 9.3   GFR: Estimated Creatinine Clearance: 122.1 mL/min (by C-G formula based on SCr of 0.84 mg/dL). Liver Function Tests: Recent Labs  Lab 01/10/19 1744 01/12/19 0923  AST 277* 384*  ALT 231* 530*  ALKPHOS 116 161*  BILITOT 1.0 1.6*  PROT 7.7 8.5*  ALBUMIN 4.1 4.7   Recent Labs  Lab 01/10/19 1744 01/12/19 0923  LIPASE 42 27   No results for input(s): AMMONIA in  the last 168 hours. Coagulation Profile: No results for input(s): INR, PROTIME in the last 168 hours. Cardiac Enzymes: No results for input(s): CKTOTAL, CKMB, CKMBINDEX, TROPONINI in the last 168 hours. BNP (last 3 results) No results for input(s): PROBNP in the last 8760 hours. HbA1C: No results for input(s): HGBA1C in the last 72 hours. CBG: No results for input(s): GLUCAP in the last 168 hours. Lipid Profile: No results for input(s): CHOL, HDL, LDLCALC, TRIG, CHOLHDL, LDLDIRECT in the last 72 hours. Thyroid Function Tests: No results for input(s): TSH, T4TOTAL, FREET4, T3FREE, THYROIDAB in the last 72 hours. Anemia Panel: No results for input(s): VITAMINB12, FOLATE, FERRITIN, TIBC, IRON, RETICCTPCT in the last 72 hours. Urine analysis:    Component Value Date/Time    COLORURINE YELLOW 01/10/2019 1744   APPEARANCEUR CLEAR 01/10/2019 1744   LABSPEC 1.014 01/10/2019 1744   PHURINE 8.0 01/10/2019 1744   GLUCOSEU NEGATIVE 01/10/2019 1744   HGBUR NEGATIVE 01/10/2019 1744   BILIRUBINUR NEGATIVE 01/10/2019 1744   KETONESUR NEGATIVE 01/10/2019 1744   PROTEINUR NEGATIVE 01/10/2019 1744   NITRITE NEGATIVE 01/10/2019 1744   LEUKOCYTESUR NEGATIVE 01/10/2019 1744   Sepsis Labs: !!!!!!!!!!!!!!!!!!!!!!!!!!!!!!!!!!!!!!!!!!!! (procalcitonin:4,lacticidven:4) ) Recent Results (from the past 240 hour(s))  SARS Coronavirus 2 (CEPHEID - Performed in [facility] hospital lab), Hosp Order     Status: None   Collection Time: 01/12/19 10:39 AM  Result Value Ref Range Status   SARS Coronavirus 2 NEGATIVE NEGATIVE Final    Comment: (NOTE) If result is NEGATIVE SARS-CoV-2 target nucleic acids are NOT DETECTED. The SARS-CoV-2 RNA is generally detectable in upper and lower  respiratory specimens during the acute phase of infection. The lowest  concentration of SARS-CoV-2 viral copies this assay can detect is 250  copies / mL. A negative result does not preclude SARS-CoV-2 infection  and should not be used as the sole basis for treatment or other  patient management decisions.  A negative result may occur with  improper specimen collection / handling, submission of specimen other  than nasopharyngeal swab, presence of viral mutation(s) within the  areas targeted by this assay, and inadequate number of viral copies  (<250 copies / mL). A negative result must be combined with clinical  observations, patient history, and epidemiological information. If result is POSITIVE SARS-CoV-2 target nucleic acids are DETECTED. The SARS-CoV-2 RNA is generally detectable in upper and lower  respiratory specimens dur ing the acute phase of infection.  Positive  results are indicative of active infection with SARS-CoV-2.  Clinical  correlation with patient history and other  diagnostic information is  necessary to determine patient infection status.  Positive results do  not rule out bacterial infection or co-infection with other viruses. If result is PRESUMPTIVE POSTIVE SARS-CoV-2 nucleic acids MAY BE PRESENT.   A presumptive positive result was obtained on the submitted specimen  and confirmed on repeat testing.  While 2019 novel coronavirus  (SARS-CoV-2) nucleic acids may be present in the submitted sample  additional confirmatory testing may be necessary for epidemiological  and / or clinical management purposes  to differentiate between  SARS-CoV-2 and other Sarbecovirus currently known to infect humans.  If clinically indicated additional testing with an alternate test  methodology [phone]) is advised. The SARS-CoV-2 RNA is generally  detectable in upper and lower respiratory sp ecimens during the acute  phase of infection. The expected result is Negative. Fact Sheet for Patients:  BoilerBrush.com.cy Fact Sheet for Healthcare Providers: https://pope.com/ This test is not yet approved or cleared by  the [employer] and has been authorized for detection and/or diagnosis of SARS-CoV-2 by FDA under an Emergency Use Authorization (EUA).  This EUA will remain in effect (meaning this test can be used) for the duration of the COVID-19 declaration under Section 564(b)(1) of the Act, 21 U.S.C. section 360bbb-3(b)(1), unless the authorization is terminated or revoked sooner. Performed at [facility], [address] [place], [place] [address]      Radiological Exams on Admission: Ct Abdomen Pelvis W Contrast  Result Date: 01/12/2019 CLINICAL DATA:  Abdominal pain and vomiting. Prior cholecystectomy and ERCP in April. EXAM: CT ABDOMEN AND PELVIS WITH CONTRAST TECHNIQUE: Multidetector CT imaging of the abdomen and pelvis was performed using the standard protocol following bolus  administration of intravenous contrast. CONTRAST:  OMNIPAQUE IOHEXOL 300 MG/ML  SOLN COMPARISON:  Abdominal ultrasound from same day. MRCP and CT abdomen pelvis dated November 20, 2018. FINDINGS: Lower chest: No acute abnormality. Bibasilar subsegmental atelectasis. Hepatobiliary: No focal liver abnormality. Status post cholecystectomy. Mild central intrahepatic biliary dilatation. The common bile duct is normal in caliber. Pancreas: Unremarkable. No pancreatic ductal dilatation or surrounding inflammatory changes. Spleen: Normal in size without focal abnormality. Adrenals/Urinary Tract: Adrenal glands are unremarkable. Kidneys are normal, without renal calculi, focal lesion, or hydronephrosis. Bladder is unremarkable. Stomach/Bowel: Stomach is within normal limits. Appendix appears normal. No evidence of bowel wall thickening, distention, or inflammatory changes. Vascular/Lymphatic: No significant vascular findings are present. No enlarged abdominal or pelvic lymph nodes. Reproductive: Prostate is unremarkable. Other: No abdominal wall hernia or abnormality. No abdominopelvic ascites. No pneumoperitoneum. Musculoskeletal: No acute or significant osseous findings. IMPRESSION: 1. Mild central intrahepatic biliary dilatation is nonspecific but could be related to post cholecystectomy state. The common bile duct is within normal limits. 2. Otherwise unremarkable CT of the abdomen and pelvis. Electronically Signed   By: Obie Dredge M.D.   On: 01/12/2019 14:15   US Abdomen Limited Ruq  Result Date: 01/12/2019 CLINICAL DATA:  Constant upper abdominal pain since vomiting 6 days ago. Elevated liver function studies. Cholecystectomy 11/23/2018 with sphincterotomy 11/25/2018. EXAM: ULTRASOUND ABDOMEN LIMITED RIGHT UPPER QUADRANT COMPARISON:  None. FINDINGS: Gallbladder: Surgically absent.  No focal right upper quadrant abdominal pain. Common bile duct: Diameter: 6 mm.  No evidence of choledocholithiasis. Liver: No  focal lesion identified. Within normal limits in parenchymal echogenicity. Portal vein is patent on color Doppler imaging with normal direction of blood flow towards the liver. IMPRESSION: No acute findings or biliary dilatation identified post cholecystectomy. Electronically Signed   By: Carey Bullocks M.D.   On: 01/12/2019 11:59    Assessment/Plan Principal Problem:   Abdominal pain Active Problems:   Elevated LFTs   Hypokalemia   Abdominal pain with elevated LFTs -CT A/P Mild central intrahepatic biliary dilatation is nonspecific but could be related to post cholecystectomy state. The common bile duct is within normal limits. Otherwise unremarkable CT of the abdomen and pelvis.  -RUQ [place]: No acute findings or biliary dilatation identified post cholecystectomy -Hepatitis panel pending -IVF -Supportive care -Avoid tylenol -PPI  -N.p.o., with ice chips -GI consulted  -Trend LFT   Hypokalemia -Replace, trend    DVT prophylaxis: SCD Code Status: Full Family Communication: Wife on speakerphone during my evaluation Disposition Plan: Pending further work up by GI, improvement in symptoms. Suspect return back home on discharge Consults called: GI called by EDP  Admission status: Inpatient    Severity of Illness: The appropriate patient status for this patient is INPATIENT. Inpatient  status is judged to be reasonable and necessary in order to provide the required intensity of service to ensure the patient's safety. The patient's presenting symptoms, physical exam findings, and initial radiographic and laboratory data in the context of their chronic comorbidities is felt to place them at high risk for further clinical deterioration. Furthermore, it is not anticipated that the patient will be medically stable for discharge from the hospital within 2 midnights of admission.   * I certify that at the point of admission it is my clinical judgment that the patient will require inpatient  hospital care spanning beyond 2 midnights from the point of admission due to high intensity of service, high risk for further deterioration and high frequency of surveillance required.Noralee Stain, DO [facility] 01/12/2019, 4:02 PM    How to contact the [facility] Attending or Consulting provider 7A - 7P or covering provider during after hours 7P -7A, for this patient?  1. Check the care team in [facility] and look for a) attending/consulting [facility] provider listed and b) the [facility] team listed 2. Log into [email or web address] and use [facility] universal password to access. If you do not have the password, please contact the hospital operator. 3. Locate the [facility] provider you are looking for under [facility] and page to a number that you can be directly reached. 4. If you still have difficulty reaching the provider, please page the [facility] (Director on Call) for the Hospitalists listed on amion for assistance.

## 2019-01-12 NOTE — ED Provider Notes (Addendum)
[facility]-EMERGENCY DEPT Provider Note   CSN: [id number] Arrival date & time: 01/12/19  0903    History   Chief Complaint Chief Complaint  Patient presents with   Abdominal Pain    HPI Alejandro Willis is a 43 y.o. male.     HPI 43 year old male with a history of cholecystectomy on November 23, 2018 presents the emergency department complaints of worsening upper abdominal pain.  He had pain several days ago seen in the emergency department was found to have mildly elevated LFTs at that time.  No imaging was performed.  He states that his pain had resolved and he was feeling better at home.  His pain is since returned last night.  He reports nausea and nonbloody vomiting this morning.  Denies fevers and chills.  He presents back to the emergency department with severe upper abdominal discomfort at this time.   History reviewed. No pertinent past medical history.  Patient Active Problem List   Diagnosis Date Noted   Nausea without vomiting 11/25/2018   Acute on chronic cholecystitis s/p lap cholecystectomy 11/23/2018 11/23/2018   Asthma 11/23/2018   Transaminitis 11/21/2018   Elevated LFTs 11/20/2018   Choledocholithiasis 11/20/2018    Past Surgical History:  Procedure Laterality Date   BILIARY DILATION  11/25/2018   Procedure: BILIARY DILATION;  Surgeon: Vida RiggerMagod, Marc, MD;  Location: [facility];  Service: Endoscopy;;   CHOLECYSTECTOMY N/A 11/23/2018   Procedure: LAPAROSCOPIC CHOLECYSTECTOMY WITH INTRAOPERATIVE CHOLANGIOGRAM;  Surgeon: Karie SodaGross, Steven, MD;  Location: [facility];  Service: General;  Laterality: N/A;   ENDOSCOPIC RETROGRADE CHOLANGIOPANCREATOGRAPHY (ERCP) WITH PROPOFOL N/A 11/25/2018   Procedure: ENDOSCOPIC RETROGRADE CHOLANGIOPANCREATOGRAPHY (ERCP) WITH PROPOFOL;  Surgeon: Vida RiggerMagod, Marc, MD;  Location: [facility];  Service: Endoscopy;  Laterality: N/A;   ERCP N/A 11/23/2018   Procedure: ENDOSCOPIC RETROGRADE CHOLANGIOPANCREATOGRAPHY (ERCP);   Surgeon: Vida RiggerMagod, Marc, MD;  Location: [facility];  Service: Endoscopy;  Laterality: N/A;   PANCREATIC STENT PLACEMENT  11/23/2018   Procedure: PANCREATIC STENT PLACEMENT;  Surgeon: Karie SodaGross, Steven, MD;  Location: [facility];  Service: General;;   REMOVAL OF STONES  11/23/2018   Procedure: REMOVAL OF STONES;  Surgeon: Karie SodaGross, Steven, MD;  Location: [facility];  Service: General;;   REMOVAL OF STONES  11/25/2018   Procedure: REMOVAL OF STONES;  Surgeon: Vida RiggerMagod, Marc, MD;  Location: [facility];  Service: Endoscopy;;   SPHINCTEROTOMY  11/23/2018   Procedure: Dennison MascotSPHINCTEROTOMY;  Surgeon: Karie SodaGross, Steven, MD;  Location: [facility];  Service: General;;   SPHINCTEROTOMY  11/25/2018   Procedure: Dennison MascotSPHINCTEROTOMY;  Surgeon: Vida RiggerMagod, Marc, MD;  Location: [facility];  Service: Endoscopy;;   STENT REMOVAL  11/25/2018   Procedure: STENT REMOVAL;  Surgeon: Vida RiggerMagod, Marc, MD;  Location: [facility];  Service: Endoscopy;;        Home Medications    Prior to Admission medications   Medication Sig Start Date End Date Taking? Authorizing Provider  albuterol (PROVENTIL HFA;VENTOLIN HFA) 108 (90 Base) MCG/ACT inhaler Inhale 2 puffs into the lungs every 6 (six) hours as needed for wheezing or shortness of breath.  09/20/18  Yes [provider]  ferrous sulfate 325 (65 FE) MG tablet Take 325 mg by mouth daily.   Yes [provider]  loratadine (CLARITIN) 10 MG tablet Take 10 mg by mouth daily as needed for allergies.    Yes [provider]  traMADol (ULTRAM) 50 MG tablet Take 50 mg by mouth every 6 (six) hours as needed for moderate pain.   Yes [provider]  hydrocortisone (ANUSOL-HC) 2.5 % rectal cream Apply 1 application topically 4 (four) times daily as needed for hemorrhoids. Patient not taking: Reported on 01/10/2019 11/28/18   Joycelyn Das, MD  ibuprofen (ADVIL,MOTRIN) 600 MG tablet Take 1 tablet (600 mg total) by mouth every 8 (eight) hours as needed for mild pain. Patient not taking: Reported on  01/10/2019 11/28/18   Joycelyn Das, MD    Family History No family history on file.  Social History Social History   Tobacco Use   Smoking status: Never Smoker   Smokeless tobacco: Never Used  Substance Use Topics   Alcohol use: No   Drug use: Yes    Frequency: 7.0 times per week    Types: Marijuana     Allergies   Other and Shellfish allergy   Review of Systems Review of Systems  All other systems reviewed and are negative.    Physical Exam Updated Vital Signs BP (!) 158/78 (BP Location: Left Arm)    Pulse 69    Temp 98.2 F (36.8 C) (Oral)    Resp 16    SpO2 98%   Physical Exam Vitals signs and nursing note reviewed.  Constitutional:      Appearance: He is well-developed.  HENT:     Head: Normocephalic and atraumatic.  Neck:     Musculoskeletal: Normal range of motion.  Cardiovascular:     Rate and Rhythm: Normal rate and regular rhythm.     Heart sounds: Normal heart sounds.  Pulmonary:     Effort: Pulmonary effort is normal. No respiratory distress.     Breath sounds: Normal breath sounds.  Abdominal:     General: There is no distension.     Palpations: Abdomen is soft.     Comments: Upper abdominal tenderness and right upper quadrant tenderness.  No peritoneal signs  Musculoskeletal: Normal range of motion.  Skin:    General: Skin is warm and dry.  Neurological:     Mental Status: He is alert and oriented to person, place, and time.  Psychiatric:        Judgment: Judgment normal.      ED Treatments / Results  Labs (all labs ordered are listed, but only abnormal results are displayed) Labs Reviewed  CBC WITH DIFFERENTIAL/PLATELET - Abnormal; Notable for the following components:      Result Value   MCH 24.5 (*)    RDW 15.6 (*)    All other components within normal limits  COMPREHENSIVE METABOLIC PANEL - Abnormal; Notable for the following components:   Potassium 3.2 (*)    Glucose, Bld 150 (*)    Total Protein 8.5 (*)    AST 384  (*)    ALT 530 (*)    Alkaline Phosphatase 161 (*)    Total Bilirubin 1.6 (*)    All other components within normal limits  SARS CORONAVIRUS 2 (HOSPITAL ORDER, PERFORMED IN [facility])  LIPASE, BLOOD  HEPATITIS PANEL, ACUTE   ALT  Date Value Ref Range Status  01/12/2019 530 (H) 0 - 44 U/L Final  01/10/2019 231 (H) 0 - 44 U/L Final  11/28/2018 841 (H) 0 - 44 U/L Final  11/27/2018 896 (H) 0 - 44 U/L Final    AST  Date Value Ref Range Status  01/12/2019 384 (H) 15 - 41 U/L Final  01/10/2019 277 (H) 15 - 41 U/L Final  11/28/2018 470 (H) 15 - 41 U/L Final  11/27/2018 646 (H) 15 - 41 U/L Final  EKG None  Radiology Ct Abdomen Pelvis W Contrast  Result Date: 01/12/2019 CLINICAL DATA:  Abdominal pain and vomiting. Prior cholecystectomy and ERCP in April. EXAM: CT ABDOMEN AND PELVIS WITH CONTRAST TECHNIQUE: Multidetector CT imaging of the abdomen and pelvis was performed using the standard protocol following bolus administration of intravenous contrast. CONTRAST:  OMNIPAQUE IOHEXOL 300 MG/ML  SOLN COMPARISON:  Abdominal ultrasound from same day. MRCP and CT abdomen pelvis dated November 20, 2018. FINDINGS: Lower chest: No acute abnormality. Bibasilar subsegmental atelectasis. Hepatobiliary: No focal liver abnormality. Status post cholecystectomy. Mild central intrahepatic biliary dilatation. The common bile duct is normal in caliber. Pancreas: Unremarkable. No pancreatic ductal dilatation or surrounding inflammatory changes. Spleen: Normal in size without focal abnormality. Adrenals/Urinary Tract: Adrenal glands are unremarkable. Kidneys are normal, without renal calculi, focal lesion, or hydronephrosis. Bladder is unremarkable. Stomach/Bowel: Stomach is within normal limits. Appendix appears normal. No evidence of bowel wall thickening, distention, or inflammatory changes. Vascular/Lymphatic: No significant vascular findings are present. No enlarged abdominal or pelvic lymph  nodes. Reproductive: Prostate is unremarkable. Other: No abdominal wall hernia or abnormality. No abdominopelvic ascites. No pneumoperitoneum. Musculoskeletal: No acute or significant osseous findings. IMPRESSION: 1. Mild central intrahepatic biliary dilatation is nonspecific but could be related to post cholecystectomy state. The common bile duct is within normal limits. 2. Otherwise unremarkable CT of the abdomen and pelvis. Electronically Signed   By: Obie Dredge M.D.   On: 01/12/2019 14:15   US Abdomen Limited Ruq  Result Date: 01/12/2019 CLINICAL DATA:  Constant upper abdominal pain since vomiting 6 days ago. Elevated liver function studies. Cholecystectomy 11/23/2018 with sphincterotomy 11/25/2018. EXAM: ULTRASOUND ABDOMEN LIMITED RIGHT UPPER QUADRANT COMPARISON:  None. FINDINGS: Gallbladder: Surgically absent.  No focal right upper quadrant abdominal pain. Common bile duct: Diameter: 6 mm.  No evidence of choledocholithiasis. Liver: No focal lesion identified. Within normal limits in parenchymal echogenicity. Portal vein is patent on color Doppler imaging with normal direction of blood flow towards the liver. IMPRESSION: No acute findings or biliary dilatation identified post cholecystectomy. Electronically Signed   By: Carey Bullocks M.D.   On: 01/12/2019 11:59    Procedures Procedures (including critical care time)  Medications Ordered in ED Medications  sodium chloride (PF) 0.9 % injection (has no administration in time range)  sodium chloride 0.9 % bolus 1,000 mL (0 mLs Intravenous Stopped 01/12/19 1037)  ondansetron (ZOFRAN) injection 4 mg (4 mg Intravenous Given 01/12/19 0931)  HYDROmorphone (DILAUDID) injection 1 mg (1 mg Intravenous Given 01/12/19 0931)  HYDROmorphone (DILAUDID) injection 1 mg (1 mg Intravenous Given 01/12/19 1037)  HYDROmorphone (DILAUDID) injection 1 mg (1 mg Intravenous Given 01/12/19 1243)  iohexol (OMNIPAQUE) 300 MG/ML solution 100 mL (100 mLs Intravenous  Contrast Given 01/12/19 1345)  HYDROmorphone (DILAUDID) injection 1 mg (1 mg Intravenous Given 01/12/19 1501)     Initial Impression / Assessment and Plan / ED Course  I have reviewed the triage vital signs and the nursing notes.  Pertinent labs & imaging results that were available during my care of the patient were reviewed by me and considered in my medical decision making (see chart for details).        Continues with upper abdominal pain and worsening LFTs in the setting of recent cholecystectomy and ERCP.  Patient will need admission to the hospital for symptom control.  Common bile duct normal in size on both ultrasound and CT imaging today.  No other obvious pathology found.  Acute hepatitis panel ordered.  Patient  is received several doses of Dilaudid in the emergency department.  He continues to be symptomatic.  Plan for GI consultation and tried hospitalist admission.  No clear etiology for surgical involvement at this time.  We will follow additional recommendations from GI at this time and plan for symptom control in the hospital Final Clinical Impressions(s) / ED Diagnoses   Final diagnoses:  Upper abdominal pain  Acute abdominal pain  Elevated liver function tests    ED Discharge Orders    None       Azalia Bilis, MD 01/12/19 1512    Azalia Bilis, MD 01/17/19 1200

## 2019-01-12 NOTE — ED Notes (Signed)
ED TO INPATIENT HANDOFF REPORT  Name/Age/Gender Alejandro Willis 43 y.o. male  Code Status Code Status History    Date Active Date Inactive Code Status Order ID Comments User Context   11/20/2018 1722 11/28/2018 1509 Full Code [id number]  Alessandra BevelsKamineni, Neelima, MD Inpatient      Home/SNF/Other Home  Chief Complaint abdominal pain emesis  Level of Care/Admitting Diagnosis ED Disposition    ED Disposition Condition Comment   Admit  [facility] [[id number]]  Level of Care: Med-Surg [16]  Covid Evaluation: N/A  Diagnosis: Abdominal pain [[id number]][[id number]]  Admitting Physician: Noralee StainCHOI, JENNIFER [[id number]]  Attending Physician: Noralee StainHOI, JENNIFER [phone][[id number]]  Estimated length of stay: 3 - 4 days  Certification:: I certify this patient will need inpatient services for at least 2 midnights  PT Class (Do Not Modify): Inpatient [101]  PT Acc Code (Do Not Modify): Private [1]       Medical History History reviewed. No pertinent past medical history.  Allergies Allergies  Allergen Reactions  . Other Anaphylaxis    Nuts  . Shellfish Allergy Anaphylaxis    IV Location/Drains/Wounds Patient Lines/Drains/Airways Status   Active Line/Drains/Airways    Name:   Placement date:   Placement time:   Site:   Days:   Peripheral IV 01/12/19 Right Antecubital   01/12/19    0930    Antecubital   less than 1   Incision (Closed) 11/23/18 N/A   11/23/18    1116     50   Incision - 1 Port Abdomen 1: Umbilicus   11/23/18    1107     50          Labs/Imaging Results for orders placed or performed during the hospital encounter of 01/12/19 (from the past 48 hour(s))  CBC with Differential/Platelet     Status: Abnormal   Collection Time: 01/12/19  9:23 AM  Result Value Ref Range   WBC 7.4 4.0 - 10.5 K/uL   RBC 5.67 4.22 - 5.81 MIL/uL   Hemoglobin 13.9 13.0 - 17.0 g/dL   HCT 65.745.7 84.639.0 - 96.252.0 %   MCV 80.6 80.0 - 100.0 fL   MCH 24.5 (L) 26.0 - 34.0 pg   MCHC 30.4 30.0 - 36.0 g/dL    RDW 95.215.6 (H) 84.111.5 - 15.5 %   Platelets 246 150 - 400 K/uL   nRBC 0.0 0.0 - 0.2 %   Neutrophils Relative % 65 %   Neutro Abs 4.8 1.7 - 7.7 K/uL   Lymphocytes Relative 22 %   Lymphs Abs 1.6 0.7 - 4.0 K/uL   Monocytes Relative 9 %   Monocytes Absolute 0.7 0.1 - 1.0 K/uL   Eosinophils Relative 3 %   Eosinophils Absolute 0.2 0.0 - 0.5 K/uL   Basophils Relative 1 %   Basophils Absolute 0.1 0.0 - 0.1 K/uL   WBC Morphology VACUOLATED NEUTROPHILS    Immature Granulocytes 0 %   Abs Immature Granulocytes 0.01 0.00 - 0.07 K/uL    Comment: Performed at [facility], [address] [place], [place] [address]  Comprehensive metabolic panel     Status: Abnormal   Collection Time: 01/12/19  9:23 AM  Result Value Ref Range   Sodium 139 135 - 145 mmol/L   Potassium 3.2 (L) 3.5 - 5.1 mmol/L   Chloride 105 98 - 111 mmol/L   CO2 24 22 - 32 mmol/L   Glucose, Bld 150 (H) 70 - 99 mg/dL   BUN 12 6 - 20  mg/dL   Creatinine, Ser 1.61 0.61 - 1.24 mg/dL   Calcium 9.3 8.9 - 09.6 mg/dL   Total Protein 8.5 (H) 6.5 - 8.1 g/dL   Albumin 4.7 3.5 - 5.0 g/dL   AST 045 (H) 15 - 41 U/L   ALT 530 (H) 0 - 44 U/L   Alkaline Phosphatase 161 (H) 38 - 126 U/L   Total Bilirubin 1.6 (H) 0.3 - 1.2 mg/dL   GFR calc non Af Amer >60 >60 mL/min   GFR calc Af Amer >60 >60 mL/min   Anion gap 10 5 - 15    Comment: Performed at [facility], [address] [place], [place] [address]  Lipase, blood     Status: None   Collection Time: 01/12/19  9:23 AM  Result Value Ref Range   Lipase 27 11 - 51 U/L    Comment: Performed at [facility], [address] [place], [place] [address]  SARS Coronavirus 2 (CEPHEID - Performed in [facility] hospital lab), Hosp Order     Status: None   Collection Time: 01/12/19 10:39 AM  Result Value Ref Range   SARS Coronavirus 2 NEGATIVE NEGATIVE    Comment: (NOTE) If result is NEGATIVE SARS-CoV-2 target nucleic acids are NOT  DETECTED. The SARS-CoV-2 RNA is generally detectable in upper and lower  respiratory specimens during the acute phase of infection. The lowest  concentration of SARS-CoV-2 viral copies this assay can detect is 250  copies / mL. A negative result does not preclude SARS-CoV-2 infection  and should not be used as the sole basis for treatment or other  patient management decisions.  A negative result may occur with  improper specimen collection / handling, submission of specimen other  than nasopharyngeal swab, presence of viral mutation(s) within the  areas targeted by this assay, and inadequate number of viral copies  (<250 copies / mL). A negative result must be combined with clinical  observations, patient history, and epidemiological information. If result is POSITIVE SARS-CoV-2 target nucleic acids are DETECTED. The SARS-CoV-2 RNA is generally detectable in upper and lower  respiratory specimens dur ing the acute phase of infection.  Positive  results are indicative of active infection with SARS-CoV-2.  Clinical  correlation with patient history and other diagnostic information is  necessary to determine patient infection status.  Positive results do  not rule out bacterial infection or co-infection with other viruses. If result is PRESUMPTIVE POSTIVE SARS-CoV-2 nucleic acids MAY BE PRESENT.   A presumptive positive result was obtained on the submitted specimen  and confirmed on repeat testing.  While 2019 novel coronavirus  (SARS-CoV-2) nucleic acids may be present in the submitted sample  additional confirmatory testing may be necessary for epidemiological  and / or clinical management purposes  to differentiate between  SARS-CoV-2 and other Sarbecovirus currently known to infect humans.  If clinically indicated additional testing with an alternate test  methodology [phone]) is advised. The SARS-CoV-2 RNA is generally  detectable in upper and lower respiratory sp ecimens during  the acute  phase of infection. The expected result is Negative. Fact Sheet for Patients:  BoilerBrush.com.cy Fact Sheet for Healthcare Providers: https://pope.com/ This test is not yet approved or cleared by the [place] FDA and has been authorized for detection and/or diagnosis of SARS-CoV-2 by FDA under an Emergency Use Authorization (EUA).  This EUA will remain in effect (meaning this test can be used) for the duration of the COVID-19 declaration under Section  564(b)(1) of the Act, 21 U.S.C. section 360bbb-3(b)(1), unless the authorization is terminated or revoked sooner. Performed at [facility], [place], [place] [address]    Ct Abdomen Pelvis W Contrast  Result Date: 01/12/2019 CLINICAL DATA:  Abdominal pain and vomiting. Prior cholecystectomy and ERCP in April. EXAM: CT ABDOMEN AND PELVIS WITH CONTRAST TECHNIQUE: Multidetector CT imaging of the abdomen and pelvis was performed using the standard protocol following bolus administration of intravenous contrast. CONTRAST:  OMNIPAQUE IOHEXOL 300 MG/ML  SOLN COMPARISON:  Abdominal ultrasound from same day. MRCP and CT abdomen pelvis dated November 20, 2018. FINDINGS: Lower chest: No acute abnormality. Bibasilar subsegmental atelectasis. Hepatobiliary: No focal liver abnormality. Status post cholecystectomy. Mild central intrahepatic biliary dilatation. The common bile duct is normal in caliber. Pancreas: Unremarkable. No pancreatic ductal dilatation or surrounding inflammatory changes. Spleen: Normal in size without focal abnormality. Adrenals/Urinary Tract: Adrenal glands are unremarkable. Kidneys are normal, without renal calculi, focal lesion, or hydronephrosis. Bladder is unremarkable. Stomach/Bowel: Stomach is within normal limits. Appendix appears normal. No evidence of bowel wall thickening, distention, or inflammatory changes. Vascular/Lymphatic: No  significant vascular findings are present. No enlarged abdominal or pelvic lymph nodes. Reproductive: Prostate is unremarkable. Other: No abdominal wall hernia or abnormality. No abdominopelvic ascites. No pneumoperitoneum. Musculoskeletal: No acute or significant osseous findings. IMPRESSION: 1. Mild central intrahepatic biliary dilatation is nonspecific but could be related to post cholecystectomy state. The common bile duct is within normal limits. 2. Otherwise unremarkable CT of the abdomen and pelvis. Electronically Signed   By: Obie Dredge M.D.   On: 01/12/2019 14:15   US Abdomen Limited Ruq  Result Date: 01/12/2019 CLINICAL DATA:  Constant upper abdominal pain since vomiting 6 days ago. Elevated liver function studies. Cholecystectomy 11/23/2018 with sphincterotomy 11/25/2018. EXAM: ULTRASOUND ABDOMEN LIMITED RIGHT UPPER QUADRANT COMPARISON:  None. FINDINGS: Gallbladder: Surgically absent.  No focal right upper quadrant abdominal pain. Common bile duct: Diameter: 6 mm.  No evidence of choledocholithiasis. Liver: No focal lesion identified. Within normal limits in parenchymal echogenicity. Portal vein is patent on color Doppler imaging with normal direction of blood flow towards the liver. IMPRESSION: No acute findings or biliary dilatation identified post cholecystectomy. Electronically Signed   By: Carey Bullocks M.D.   On: 01/12/2019 11:59    Pending Labs Unresulted Labs (From admission, onward)    Start     Ordered   01/12/19 1220  Hepatitis panel, acute  Add-on,   STAT     01/12/19 1219   Signed and Held  CBC  Tomorrow morning,   R     Signed and Held   Signed and Held  Basic metabolic panel  Tomorrow morning,   R     Signed and Held   Signed and Held  Hepatic function panel  Tomorrow morning,   R     Signed and Held          Vitals/Pain Today's Vitals   01/12/19 1430 01/12/19 1445 01/12/19 1456 01/12/19 1534  BP: 137/81 (!) 176/98    Pulse: 91 93    Resp: 20 19    Temp:       TempSrc:      SpO2: 95% 96%    PainSc:   10-Worst pain ever 2     Isolation Precautions No active isolations  Medications Medications  sodium chloride (PF) 0.9 % injection (has no administration in time range)  pantoprazole (PROTONIX) 80 mg in sodium chloride 0.9 % 100  mL IVPB (80 mg Intravenous New Bag/Given (Non-Interop) 01/12/19 1620)  HYDROmorphone (DILAUDID) injection 1 mg (has no administration in time range)  morphine 2 MG/ML injection 2 mg (has no administration in time range)  sodium chloride 0.9 % bolus 1,000 mL (0 mLs Intravenous Stopped 01/12/19 1037)  ondansetron (ZOFRAN) injection 4 mg (4 mg Intravenous Given 01/12/19 0931)  HYDROmorphone (DILAUDID) injection 1 mg (1 mg Intravenous Given 01/12/19 0931)  HYDROmorphone (DILAUDID) injection 1 mg (1 mg Intravenous Given 01/12/19 1037)  HYDROmorphone (DILAUDID) injection 1 mg (1 mg Intravenous Given 01/12/19 1243)  iohexol (OMNIPAQUE) 300 MG/ML solution 100 mL (100 mLs Intravenous Contrast Given 01/12/19 1345)  HYDROmorphone (DILAUDID) injection 1 mg (1 mg Intravenous Given 01/12/19 1501)    Mobility walks

## 2019-01-13 ENCOUNTER — Inpatient Hospital Stay (HOSPITAL_COMMUNITY): Payer: BLUE CROSS/BLUE SHIELD | Admitting: Registered Nurse

## 2019-01-13 ENCOUNTER — Encounter (HOSPITAL_COMMUNITY): Payer: Self-pay | Admitting: Registered Nurse

## 2019-01-13 ENCOUNTER — Inpatient Hospital Stay (HOSPITAL_COMMUNITY): Payer: BLUE CROSS/BLUE SHIELD

## 2019-01-13 ENCOUNTER — Encounter (HOSPITAL_COMMUNITY)
Admission: EM | Disposition: A | Discharge status: Home / Self Care | Payer: Self-pay | Source: Home / Self Care | Attending: Family Medicine

## 2019-01-13 DIAGNOSIS — E876 Hypokalemia: Secondary | ICD-10-CM

## 2019-01-13 DIAGNOSIS — R945 Abnormal results of liver function studies: Secondary | ICD-10-CM

## 2019-01-13 HISTORY — PX: ERCP: SHX5425

## 2019-01-13 HISTORY — PX: SPHINCTEROTOMY: SHX5544

## 2019-01-13 LAB — CBC
HCT: 38.4 % — ABNORMAL LOW (ref 39.0–52.0)
Hemoglobin: 11.7 g/dL — ABNORMAL LOW (ref 13.0–17.0)
MCH: 24.9 pg — ABNORMAL LOW (ref 26.0–34.0)
MCHC: 30.5 g/dL (ref 30.0–36.0)
MCV: 81.7 fL (ref 80.0–100.0)
Platelets: 195 10*3/uL (ref 150–400)
RBC: 4.7 MIL/uL (ref 4.22–5.81)
RDW: 15.7 % — ABNORMAL HIGH (ref 11.5–15.5)
WBC: 24.5 10*3/uL — ABNORMAL HIGH (ref 4.0–10.5)
nRBC: 0 % (ref 0.0–0.2)

## 2019-01-13 LAB — BASIC METABOLIC PANEL
Anion gap: 8 (ref 5–15)
BUN: 12 mg/dL (ref 6–20)
CO2: 23 mmol/L (ref 22–32)
Calcium: 8.7 mg/dL — ABNORMAL LOW (ref 8.9–10.3)
Chloride: 107 mmol/L (ref 98–111)
Creatinine, Ser: 0.8 mg/dL (ref 0.61–1.24)
GFR calc Af Amer: >60 mL/min (ref >60)
GFR calc non Af Amer: >60 mL/min (ref >60)
Glucose, Bld: 114 mg/dL — ABNORMAL HIGH (ref 70–99)
Potassium: 3.9 mmol/L (ref 3.5–5.1)
Sodium: 138 mmol/L (ref 135–145)

## 2019-01-13 LAB — HEPATITIS PANEL, ACUTE
HCV Ab: 0.7 s/co ratio (ref 0.0–0.9)
Hep A IgM: NEGATIVE
Hep B C IgM: NEGATIVE
Hepatitis B Surface Ag: NEGATIVE

## 2019-01-13 LAB — HEPATIC FUNCTION PANEL
ALT: 380 U/L — ABNORMAL HIGH (ref 0–44)
AST: 181 U/L — ABNORMAL HIGH (ref 15–41)
Albumin: 3.8 g/dL (ref 3.5–5.0)
Alkaline Phosphatase: 139 U/L — ABNORMAL HIGH (ref 38–126)
Bilirubin, Direct: 2.4 mg/dL — ABNORMAL HIGH (ref 0.0–0.2)
Indirect Bilirubin: 1.1 mg/dL — ABNORMAL HIGH (ref 0.3–0.9)
Total Bilirubin: 3.5 mg/dL — ABNORMAL HIGH (ref 0.3–1.2)
Total Protein: 7 g/dL (ref 6.5–8.1)

## 2019-01-13 SURGERY — ERCP, WITH INTERVENTION IF INDICATED
Anesthesia: General

## 2019-01-13 MED ORDER — PROPOFOL 10 MG/ML IV BOLUS
INTRAVENOUS | Status: AC
  Filled 2019-01-13: qty 20

## 2019-01-13 MED ORDER — GLYCOPYRROLATE PF 0.2 MG/ML IJ SOSY
PREFILLED_SYRINGE | INTRAMUSCULAR | Status: DC | PRN
  Administered 2019-01-13: .1 mg via INTRAVENOUS

## 2019-01-13 MED ORDER — GLUCAGON HCL RDNA (DIAGNOSTIC) 1 MG IJ SOLR
INTRAMUSCULAR | Status: AC
  Filled 2019-01-13: qty 1

## 2019-01-13 MED ORDER — LACTATED RINGERS IV SOLN
INTRAVENOUS | Status: DC | PRN
  Administered 2019-01-13 (×2): via INTRAVENOUS

## 2019-01-13 MED ORDER — SODIUM CHLORIDE 0.9 % IV SOLN
INTRAVENOUS | Status: DC | PRN
  Administered 2019-01-13: 30 mL

## 2019-01-13 MED ORDER — ROCURONIUM BROMIDE 10 MG/ML (PF) SYRINGE
PREFILLED_SYRINGE | INTRAVENOUS | Status: DC | PRN
  Administered 2019-01-13: 50 mg via INTRAVENOUS
  Administered 2019-01-13: 10 mg via INTRAVENOUS

## 2019-01-13 MED ORDER — DEXAMETHASONE SODIUM PHOSPHATE 10 MG/ML IJ SOLN
INTRAMUSCULAR | Status: DC | PRN
  Administered 2019-01-13: 10 mg via INTRAVENOUS

## 2019-01-13 MED ORDER — SUGAMMADEX SODIUM 500 MG/5ML IV SOLN
INTRAVENOUS | Status: DC | PRN
  Administered 2019-01-13: 300 mg via INTRAVENOUS

## 2019-01-13 MED ORDER — LIDOCAINE 2% (20 MG/ML) 5 ML SYRINGE
INTRAMUSCULAR | Status: DC | PRN
  Administered 2019-01-13: 75 mg via INTRAVENOUS
  Administered 2019-01-13: 25 mg via INTRAVENOUS

## 2019-01-13 MED ORDER — MIDAZOLAM HCL 5 MG/5ML IJ SOLN
INTRAMUSCULAR | Status: DC | PRN
  Administered 2019-01-13 (×2): 1 mg via INTRAVENOUS

## 2019-01-13 MED ORDER — LACTATED RINGERS IV SOLN
INTRAVENOUS | Status: DC
  Administered 2019-01-13: 1000 mL via INTRAVENOUS

## 2019-01-13 MED ORDER — FENTANYL CITRATE (PF) 100 MCG/2ML IJ SOLN
INTRAMUSCULAR | Status: AC
  Filled 2019-01-13: qty 2

## 2019-01-13 MED ORDER — ONDANSETRON HCL 4 MG/2ML IJ SOLN
INTRAMUSCULAR | Status: DC | PRN
  Administered 2019-01-13: 4 mg via INTRAVENOUS

## 2019-01-13 MED ORDER — FENTANYL CITRATE (PF) 100 MCG/2ML IJ SOLN
INTRAMUSCULAR | Status: DC | PRN
  Administered 2019-01-13 (×2): 50 ug via INTRAVENOUS

## 2019-01-13 MED ORDER — INDOMETHACIN 50 MG RE SUPP
RECTAL | Status: AC
  Filled 2019-01-13: qty 2

## 2019-01-13 MED ORDER — PROPOFOL 10 MG/ML IV BOLUS
INTRAVENOUS | Status: DC | PRN
  Administered 2019-01-13: 160 mg via INTRAVENOUS
  Administered 2019-01-13: 50 mg via INTRAVENOUS
  Administered 2019-01-13: 40 mg via INTRAVENOUS

## 2019-01-13 NOTE — Anesthesia Preprocedure Evaluation (Signed)
Anesthesia Evaluation  Patient identified by MRN, date of birth, ID band Patient awake    Reviewed: Allergy & Precautions, H&P , NPO status , Patient's Chart, lab work & pertinent test results  Airway Mallampati: II   Neck ROM: full    Dental   Pulmonary asthma ,    breath sounds clear to auscultation       Cardiovascular negative cardio ROS   Rhythm:regular Rate:Normal     Neuro/Psych    GI/Hepatic Recent cholecystectomy for cholelithiasis   Endo/Other    Renal/GU      Musculoskeletal   Abdominal   Peds  Hematology   Anesthesia Other Findings   Reproductive/Obstetrics                             Anesthesia Physical Anesthesia Plan  ASA: II  Anesthesia Plan: General   Post-op Pain Management:    Induction: Intravenous  PONV Risk Score and Plan: 2 and Ondansetron, Dexamethasone, Midazolam and Treatment may vary due to age or medical condition  Airway Management Planned: Oral ETT  Additional Equipment:   Intra-op Plan:   Post-operative Plan:   Informed Consent: I have reviewed the patients History and Physical, chart, labs and discussed the procedure including the risks, benefits and alternatives for the proposed anesthesia with the patient or authorized representative who has indicated his/her understanding and acceptance.       Plan Discussed with: CRNA, Anesthesiologist and Surgeon  Anesthesia Plan Comments:         Anesthesia Quick Evaluation

## 2019-01-13 NOTE — Transfer of Care (Signed)
Immediate Anesthesia Transfer of Care Note  Patient: Alejandro Willis  Procedure(s) Performed: ENDOSCOPIC RETROGRADE CHOLANGIOPANCREATOGRAPHY (ERCP) (N/A ) SPHINCTEROTOMY  Patient Location: PACU  Anesthesia Type:General  Level of Consciousness: awake, alert , oriented and patient cooperative  Airway & Oxygen Therapy: Patient Spontanous Breathing and Patient connected to face mask oxygen  Post-op Assessment: Report given to RN, Post -op Vital signs reviewed and stable and Patient moving all extremities X 4  Post vital signs: stable  Last Vitals:  Vitals Value Taken Time  BP 110/58 01/13/2019  2:07 PM  Temp 36.8 C 01/13/2019  2:00 PM  Pulse 66 01/13/2019  2:09 PM  Resp 20 01/13/2019  2:09 PM  SpO2 100 % 01/13/2019  2:09 PM  Vitals shown include unvalidated device data.  Last Pain:  Vitals:   01/13/19 1400  TempSrc: Oral  PainSc: 0-No pain      Patients Stated Pain Goal: 2 (01/12/19 2326)  Complications: No apparent anesthesia complications

## 2019-01-13 NOTE — Progress Notes (Signed)
Patient had nausea last night, but today feels absolutely fine, with no residual pain.  LFT's about 40% better past 24hrs, except for slt rise in T. Bili (delayed rise like that not uncommon).  Significant rise in white blood count noted, from 7.4 to current level of 24,500 over past 24 hours.  However, no associated fever or clinical toxicity.  On exam, abdomen is completely nontender.  For ERCP later today.  Have discussed with Dr. Ewing Schlein, who will be doing procedure.  Alejandro Willis, M.D. Pager [phone] If no answer or after 5 PM call [phone]

## 2019-01-13 NOTE — Op Note (Signed)
[facility] Patient Name: Alejandro Willis Procedure Date: 01/13/2019 MRN: [id number] Attending MD: Vida Rigger , MD Date of Birth: [birth year] CSN: [id number] Age: 43 Admit Type: Inpatient Procedure:                ERCP Indications:              Abdominal pain of suspected biliary origin,                            Suspected bile duct stone(s), Elevated liver                            enzymes and elevated white count history of CBD                            stones and gallstones Providers:                Vida Rigger, MD, Dwain Sarna, RN, Brion Aliment,                            Technician, Anastasio Champion, CRNA Referring MD:              Medicines:                General Anesthesia Complications:            No immediate complications. Estimated Blood Loss:     Estimated blood loss: none. Procedure:                Pre-Anesthesia Assessment:                           - Prior to the procedure, a History and Physical                            was performed, and patient medications and                            allergies were reviewed. The patient's tolerance of                            previous anesthesia was also reviewed. The risks                            and benefits of the procedure and the sedation                            options and risks were discussed with the patient.                            All questions were answered, and informed consent                            was obtained. Prior Anticoagulants: The patient has                            taken no  previous anticoagulant or antiplatelet                            agents. ASA Grade Assessment: II - A patient with                            mild systemic disease. After reviewing the risks                            and benefits, the patient was deemed in                            satisfactory condition to undergo the procedure.                           After obtaining informed consent, the  scope was                            passed under direct vision. Throughout the                            procedure, the patient's blood pressure, pulse, and                            oxygen saturations were monitored continuously. The                            [id number]-Q180V ([id number] Olympus duodenoscope was                            introduced through the mouth, and used to inject                            contrast into and used to cannulate the bile duct.                            The ERCP was accomplished without difficulty. The                            patient tolerated the procedure well. Scope In: Scope Out: Findings:      Deep selective cannulation was readily obtained via the previous       sphincterotomy site and there was no pancreatic duct injection or wire       advancement throughout the procedure and A biliary sphincterotomy had       been performed. The sphincterotomy appeared open. We increased the       biliary sphincterotomy was made with a Hydratome sphincterotome using       ERBE electrocautery. There was no post-sphincterotomy bleeding. We had       adequate biliary drainage and could get the fully bowed sphincterotome       in and out of the duct and there was air versus stones on the initial       cholangiogram and the biliary tree was swept with an adjustable 9- 12 mm  balloon starting at the bifurcation and we used both size balloons which       passed readily through the patent sphincterotomy site and multiple       occlusion cholangiograms were done which did not reveal any residual       stones and minimal sludge was swept from the duct on the initial       pull-through only. We then cannulated the cystic duct remnant pulled the       9 mm balloon through that again without abnormality and we again       proceeded with an occlusion cholangiogram which did not reveal any       residual defects and there was adequate biliary drainage and we elected        to stop the procedure at this point Impression:               - Prior biliary sphincterotomy appeared open.                           - A biliary sphincterotomy was performed to                            increase the sphincterotomy site.                           - The biliary tree was swept and sludge was found.                            The cystic duct remnant was swept as well and there                            was no pancreatic duct injection or wire advancement Moderate Sedation:      Not Applicable - Patient had care per Anesthesia. Recommendation:           - Clear liquid diet for 6 hours. May have soft                            solids if doing well afterward                           - Continue present medications.                           - Return to GI clinic PRN.                           - Telephone GI clinic if symptomatic PRN.                           - Check liver enzymes (AST, ALT, alkaline                            phosphatase, bilirubin) tomorrow. May follow-up in                            the office to ensure back to normal                           -  Check hemogram with white blood cell count and                            platelets tomorrow. Procedure Code(s):        --- Professional ---                           [phone], Endoscopic retrograde                            cholangiopancreatography (ERCP); with removal of                            calculi/debris from biliary/pancreatic duct(s)                           43262, Endoscopic retrograde                            cholangiopancreatography (ERCP); with                            sphincterotomy/papillotomy Diagnosis Code(s):        --- Professional ---                           R10.9, Unspecified abdominal pain                           R74.8, Abnormal levels of other serum enzymes CPT copyright 2019 American Medical Association. All rights reserved. The codes documented in this report are preliminary  and upon coder review may  be revised to meet current compliance requirements. Vida Rigger, MD 01/13/2019 1:56:50 PM This report has been signed electronically. Number of Addenda: 0

## 2019-01-13 NOTE — Progress Notes (Signed)
PROGRESS NOTE    Alejandro Willis  WUJ:[id number] DOB: Dec 29, 1975 DOA: 01/12/2019 PCP: Patient, No Pcp Per   Brief Narrative:  Alejandro Willis is a 43 y.o. male with medical history significant of choledocholithiasis who presented to ED on 01/12/2019 with recurrent abdominal pain. He is status post ERCP with removal of CBD stones and stent placement on 11/23/2018 but with trendingup ofLFTs. Status post laparoscopic cholecystectomy with intraoperative cholangiogram on 11/23/2018 by general surgery. Patient underwent ERCP with sphincterectomy repeatedon 11/25/18 due to rising LFTs. CBD stoneswere then cleared at that time.  He was again evaluated in the emergency department on 5/24 for periumbilical abdominal pain.  At that time, his liver enzymes were elevated.  His abdominal pain improved while in the emergency department and was discharged home.  He then returned with recurrent abdominal pain which started night before his presentation at around 9 PM.    Denied fevers, chills, night sweats, chest pain, shortness of breath or cough.  No diarrhea.  He was once again admitted to hospital service for possible choledocholithiasis.  GI was consulted.  Consultants:   GI  Procedures:   ERCP  Antimicrobials:   Rocephin   Subjective: Patient seen and examined for ERCP today.  He had no more abdominal pain.  No other complaint.  Objective: Vitals:   01/13/19 1425 01/13/19 1430 01/13/19 1435 01/13/19 1505  BP:  124/77  132/82  Pulse: 63 76 92 73  Resp: 19 (!) 25 (!) 29 (!) 21  Temp:    98.6 F (37 C)  TempSrc:      SpO2: 100% 100% 97% 95%  Weight:      Height:        Intake/Output Summary (Last 24 hours) at 01/13/2019 1511 Last data filed at 01/13/2019 1358 Gross per 24 hour  Intake 2932.43 ml  Output 0 ml  Net 2932.43 ml   Filed Weights   01/12/19 1748  Weight: 90.7 kg    Examination:  General exam: Appears calm and comfortable  Respiratory system: Clear to  auscultation. Respiratory effort normal. Cardiovascular system: S1 & S2 heard, RRR. No JVD, murmurs, rubs, gallops or clicks. No pedal edema. Gastrointestinal system: Abdomen is nondistended, soft and nontender. No organomegaly or masses felt. Normal bowel sounds heard. Central nervous system: Alert and oriented. No focal neurological deficits. Extremities: Symmetric 5 x 5 power. Skin: No rashes, lesions or ulcers Psychiatry: Judgement and insight appear normal. Mood & affect appropriate.    Data Reviewed: I have personally reviewed following labs and imaging studies  CBC: Recent Labs  Lab 01/10/19 1744 01/12/19 0923 01/13/19 0638  WBC 10.2 7.4 24.5*  NEUTROABS  --  4.8  --   HGB 12.9* 13.9 11.7*  HCT 42.3 45.7 38.4*  MCV 82.3 80.6 81.7  PLT 222 246 195   Basic Metabolic Panel: Recent Labs  Lab 01/10/19 1744 01/12/19 0923 01/13/19 0638  NA 139 139 138  K 3.4* 3.2* 3.9  CL 107 105 107  CO2 GLUCOSE 121* 150* 114*  BUN CREATININE 0.85 0.84 0.80  CALCIUM 9.0 9.3 8.7*   GFR: Estimated Creatinine Clearance: 128.2 mL/min (by C-G formula based on SCr of 0.8 mg/dL). Liver Function Tests: Recent Labs  Lab 01/10/19 1744 01/12/19 0923 01/13/19 0638  AST 277* 384* 181*  ALT 231* 530* 380*  ALKPHOS 116 161* 139*  BILITOT 1.0 1.6* 3.5*  PROT 7.7 8.5* 7.0  ALBUMIN 4.1 4.7 3.8   Recent Labs  Lab 01/10/19 1744 01/12/19 0923  LIPASE 42 27   No results for input(s): AMMONIA in the last 168 hours. Coagulation Profile: No results for input(s): INR, PROTIME in the last 168 hours. Cardiac Enzymes: No results for input(s): CKTOTAL, CKMB, CKMBINDEX, TROPONINI in the last 168 hours. BNP (last 3 results) No results for input(s): PROBNP in the last 8760 hours. HbA1C: No results for input(s): HGBA1C in the last 72 hours. CBG: No results for input(s): GLUCAP in the last 168 hours. Lipid Profile: No results for input(s): CHOL, HDL, LDLCALC, TRIG, CHOLHDL,  LDLDIRECT in the last 72 hours. Thyroid Function Tests: No results for input(s): TSH, T4TOTAL, FREET4, T3FREE, THYROIDAB in the last 72 hours. Anemia Panel: No results for input(s): VITAMINB12, FOLATE, FERRITIN, TIBC, IRON, RETICCTPCT in the last 72 hours. Sepsis Labs: No results for input(s): PROCALCITON, LATICACIDVEN in the last 168 hours.  Recent Results (from the past 240 hour(s))  SARS Coronavirus 2 (CEPHEID - Performed in [facility] hospital lab), Hosp Order     Status: None   Collection Time: 01/12/19 10:39 AM  Result Value Ref Range Status   SARS Coronavirus 2 NEGATIVE NEGATIVE Final    Comment: (NOTE) If result is NEGATIVE SARS-CoV-2 target nucleic acids are NOT DETECTED. The SARS-CoV-2 RNA is generally detectable in upper and lower  respiratory specimens during the acute phase of infection. The lowest  concentration of SARS-CoV-2 viral copies this assay can detect is 250  copies / mL. A negative result does not preclude SARS-CoV-2 infection  and should not be used as the sole basis for treatment or other  patient management decisions.  A negative result may occur with  improper specimen collection / handling, submission of specimen other  than nasopharyngeal swab, presence of viral mutation(s) within the  areas targeted by this assay, and inadequate number of viral copies  (<250 copies / mL). A negative result must be combined with clinical  observations, patient history, and epidemiological information. If result is POSITIVE SARS-CoV-2 target nucleic acids are DETECTED. The SARS-CoV-2 RNA is generally detectable in upper and lower  respiratory specimens dur ing the acute phase of infection.  Positive  results are indicative of active infection with SARS-CoV-2.  Clinical  correlation with patient history and other diagnostic information is  necessary to determine patient infection status.  Positive results do  not rule out bacterial infection or co-infection with other  viruses. If result is PRESUMPTIVE POSTIVE SARS-CoV-2 nucleic acids MAY BE PRESENT.   A presumptive positive result was obtained on the submitted specimen  and confirmed on repeat testing.  While 2019 novel coronavirus  (SARS-CoV-2) nucleic acids may be present in the submitted sample  additional confirmatory testing may be necessary for epidemiological  and / or clinical management purposes  to differentiate between  SARS-CoV-2 and other Sarbecovirus currently known to infect humans.  If clinically indicated additional testing with an alternate test  methodology [phone]) is advised. The SARS-CoV-2 RNA is generally  detectable in upper and lower respiratory sp ecimens during the acute  phase of infection. The expected result is Negative. Fact Sheet for Patients:  BoilerBrush.com.cy Fact Sheet for Healthcare Providers: https://pope.com/ This test is not yet approved or cleared by the [place] FDA and has been authorized for detection and/or diagnosis of SARS-CoV-2 by FDA under an Emergency Use Authorization (EUA).  This EUA will remain in effect (meaning this test can be used) for the duration of the COVID-19 declaration under Section 564(b)(1) of the Act, 21 U.S.C.  section 360bbb-3(b)(1), unless the authorization is terminated or revoked sooner. Performed at [facility], [address] [place], [place] [address]       Radiology Studies: Ct Abdomen Pelvis W Contrast  Result Date: 01/12/2019 CLINICAL DATA:  Abdominal pain and vomiting. Prior cholecystectomy and ERCP in April. EXAM: CT ABDOMEN AND PELVIS WITH CONTRAST TECHNIQUE: Multidetector CT imaging of the abdomen and pelvis was performed using the standard protocol following bolus administration of intravenous contrast. CONTRAST:  100mL OMNIPAQUE IOHEXOL 300 MG/ML  SOLN COMPARISON:  Abdominal ultrasound from same day. MRCP and CT abdomen pelvis dated November 20, 2018. FINDINGS: Lower chest: No acute abnormality. Bibasilar subsegmental atelectasis. Hepatobiliary: No focal liver abnormality. Status post cholecystectomy. Mild central intrahepatic biliary dilatation. The common bile duct is normal in caliber. Pancreas: Unremarkable. No pancreatic ductal dilatation or surrounding inflammatory changes. Spleen: Normal in size without focal abnormality. Adrenals/Urinary Tract: Adrenal glands are unremarkable. Kidneys are normal, without renal calculi, focal lesion, or hydronephrosis. Bladder is unremarkable. Stomach/Bowel: Stomach is within normal limits. Appendix appears normal. No evidence of bowel wall thickening, distention, or inflammatory changes. Vascular/Lymphatic: No significant vascular findings are present. No enlarged abdominal or pelvic lymph nodes. Reproductive: Prostate is unremarkable. Other: No abdominal wall hernia or abnormality. No abdominopelvic ascites. No pneumoperitoneum. Musculoskeletal: No acute or significant osseous findings. IMPRESSION: 1. Mild central intrahepatic biliary dilatation is nonspecific but could be related to post cholecystectomy state. The common bile duct is within normal limits. 2. Otherwise unremarkable CT of the abdomen and pelvis. Electronically Signed   By: Obie DredgeWilliam T Derry M.D.   On: 01/12/2019 14:15   Dg Ercp Biliary & Pancreatic Ducts  Result Date: 01/13/2019 CLINICAL DATA:  43 year old male with choledocholithiasis EXAM: ERCP TECHNIQUE: Multiple spot images obtained with the fluoroscopic device and submitted for interpretation post-procedure. FLUOROSCOPY TIME:  Fluoroscopy Time:  6 minutes 18 seconds COMPARISON:  None. FINDINGS: Multiple intraoperative fluoroscopic spot images of ERCP. Initial image demonstrates endoscope projecting over the upper abdomen with cannulation of the ampulla and partial opacification of the extrahepatic biliary ducts. Multiple filling defects within the common bile duct, potentially air bubbles,  stones/debris. Images during the case demonstrate deployment of a retrieval balloon. IMPRESSION: Limited intraoperative fluoroscopic spot images during ERCP demonstrates treatment of choledocholithiasis with deployment of a retrieval balloon. Please refer to the dictated operative report for full details of intraoperative findings and procedure. Electronically Signed   By: Gilmer MorJaime  Wagner D.O.   On: 01/13/2019 14:03   [place] Abdomen Limited Ruq  Result Date: 01/12/2019 CLINICAL DATA:  Constant upper abdominal pain since vomiting 6 days ago. Elevated liver function studies. Cholecystectomy 11/23/2018 with sphincterotomy 11/25/2018. EXAM: ULTRASOUND ABDOMEN LIMITED RIGHT UPPER QUADRANT COMPARISON:  None. FINDINGS: Gallbladder: Surgically absent.  No focal right upper quadrant abdominal pain. Common bile duct: Diameter: 6 mm.  No evidence of choledocholithiasis. Liver: No focal lesion identified. Within normal limits in parenchymal echogenicity. Portal vein is patent on color Doppler imaging with normal direction of blood flow towards the liver. IMPRESSION: No acute findings or biliary dilatation identified post cholecystectomy. Electronically Signed   By: Carey BullocksWilliam  Veazey M.D.   On: 01/12/2019 11:59    Scheduled Meds: Continuous Infusions: . 0.9 % NaCl with KCl 40 mEq / L 100 mL/hr (01/12/19 1714)  . cefTRIAXone (ROCEPHIN)  IV 2 g (01/12/19 1823)  . pantoprazole (PROTONIX) IV 80 mg (01/13/19 0920)     LOS: 1 day   Assessment & Plan:   Principal Problem:   Abdominal  pain Active Problems:   Elevated LFTs   Hypokalemia  Recurrent abdominal pain due to possible recurrent choledocholithiasis/intrahepatic biliary dilatation: Interestingly, he feels much better however his leukocytosis jumped all the way to 24,000 from normal yesterday but he remains afebrile and asymptomatic.  LFTs improved but bilirubin jumped a little bit.  He underwent ERCP today with GI.  Further management per them.  Hypokalemia:  Replaced.  DVT prophylaxis: SCD Code Status: Full code Family Communication: Discussed with patient. Disposition Plan: To be determined   Time spent: 30 minutes   Hughie Closs, MD [facility] Pager [phone]  If 7PM-7AM, please contact night-coverage [email or web address] Password [email or web address] 01/13/2019, 3:11 PM

## 2019-01-13 NOTE — Anesthesia Postprocedure Evaluation (Signed)
Anesthesia Post Note  Patient: Carmichael Abdus-Salaam  Procedure(s) Performed: ENDOSCOPIC RETROGRADE CHOLANGIOPANCREATOGRAPHY (ERCP) (N/A ) SPHINCTEROTOMY     Patient location during evaluation: PACU Anesthesia Type: General Level of consciousness: awake and alert Pain management: pain level controlled Vital Signs Assessment: post-procedure vital signs reviewed and stable Respiratory status: spontaneous breathing, nonlabored ventilation and respiratory function stable Cardiovascular status: blood pressure returned to baseline and stable Postop Assessment: no apparent nausea or vomiting Anesthetic complications: no    Last Vitals:  Vitals:   01/13/19 1414 01/13/19 1415  BP: 112/65   Pulse: 73 66  Resp: 20 18  Temp:    SpO2: 100% 100%    Last Pain:  Vitals:   01/13/19 1410  TempSrc:   PainSc: Asleep                 Lowella Curb

## 2019-01-13 NOTE — Anesthesia Procedure Notes (Signed)
Procedure Name: Intubation Date/Time: 01/13/2019 12:54 PM Performed by: Lissa Morales, CRNA Pre-anesthesia Checklist: Patient identified, Emergency Drugs available, Suction available and Patient being monitored Patient Re-evaluated:Patient Re-evaluated prior to induction Oxygen Delivery Method: Circle system utilized Preoxygenation: Pre-oxygenation with 100% oxygen Induction Type: IV induction Ventilation: Mask ventilation without difficulty Laryngoscope Size: Mac and 4 Grade View: Grade II Tube type: Oral Tube size: 7.5 mm Number of attempts: 1 Airway Equipment and Method: Stylet and Oral airway Placement Confirmation: ETT inserted through vocal cords under direct vision,  positive ETCO2 and breath sounds checked- equal and bilateral Secured at: 22 cm Tube secured with: Tape Dental Injury: Teeth and Oropharynx as per pre-operative assessment

## 2019-01-13 NOTE — Progress Notes (Signed)
Alejandro Willis 12:43 PM  Subjective: Patient doing better without any new complaints and we rediscussed his previous procedures which he did not have any problems with and he was fine until this weekend case discussed with my partner Dr. Randa Evens  Objective: Signs stable afebrile exam please see preassessment evaluation bili increased other liver tests decreased white count increased  Assessment: Recurrent CBD stones  Plan: Okay to proceed with ERCP with anesthesia assistance  [facility]  office [phone] After 5PM or if no answer call [phone]

## 2019-01-14 DIAGNOSIS — K8050 Calculus of bile duct without cholangitis or cholecystitis without obstruction: Secondary | ICD-10-CM

## 2019-01-14 LAB — COMPREHENSIVE METABOLIC PANEL
ALT: 261 U/L — ABNORMAL HIGH (ref 0–44)
AST: 93 U/L — ABNORMAL HIGH (ref 15–41)
Albumin: 3.4 g/dL — ABNORMAL LOW (ref 3.5–5.0)
Alkaline Phosphatase: 139 U/L — ABNORMAL HIGH (ref 38–126)
Anion gap: 9 (ref 5–15)
BUN: 10 mg/dL (ref 6–20)
CO2: 23 mmol/L (ref 22–32)
Calcium: 8.9 mg/dL (ref 8.9–10.3)
Chloride: 106 mmol/L (ref 98–111)
Creatinine, Ser: 0.98 mg/dL (ref 0.61–1.24)
GFR calc Af Amer: >60 mL/min (ref >60)
GFR calc non Af Amer: >60 mL/min (ref >60)
Glucose, Bld: 104 mg/dL — ABNORMAL HIGH (ref 70–99)
Potassium: 3.9 mmol/L (ref 3.5–5.1)
Sodium: 138 mmol/L (ref 135–145)
Total Bilirubin: 1.1 mg/dL (ref 0.3–1.2)
Total Protein: 6.9 g/dL (ref 6.5–8.1)

## 2019-01-14 LAB — CBC
HCT: 39.8 % (ref 39.0–52.0)
Hemoglobin: 12.3 g/dL — ABNORMAL LOW (ref 13.0–17.0)
MCH: 25.2 pg — ABNORMAL LOW (ref 26.0–34.0)
MCHC: 30.9 g/dL (ref 30.0–36.0)
MCV: 81.4 fL (ref 80.0–100.0)
Platelets: 197 10*3/uL (ref 150–400)
RBC: 4.89 MIL/uL (ref 4.22–5.81)
RDW: 15.7 % — ABNORMAL HIGH (ref 11.5–15.5)
WBC: 16.3 10*3/uL — ABNORMAL HIGH (ref 4.0–10.5)
nRBC: 0 % (ref 0.0–0.2)

## 2019-01-14 NOTE — Discharge Summary (Signed)
Physician Discharge Summary  Alejandro Willis WUJ:[id number] DOB: January 17, 1976 DOA: 01/12/2019  PCP: Patient, No Pcp Per  Admit date: 01/12/2019 Discharge date: 01/14/2019  Admitted From: Home Disposition: Home  Recommendations for Outpatient Follow-up:  1. Follow up with PCP in 1-2 weeks 2. To follow with GI in 2 weeks 3. Please obtain BMP/CBC in one week 4. Please follow up on the following pending results:  Home Health: None Equipment/Devices: None  Discharge Condition: Stable CODE STATUS: Full code Diet recommendation: Regular diet  Subjective: Seen and examined.  No complaints.  No abdominal pain.  Brief/Interim Summary: Alejandro Abdus-Salaamis a 42 y.o.malewith medical history significant ofcholedocholithiasis who presented to ED on 01/12/2019 with recurrent abdominal pain. He is status post ERCP with removal of CBD stones and stent placement on 11/23/2018 but with trendingup ofLFTs. Status post laparoscopic cholecystectomy with intraoperative cholangiogram on 11/23/2018 by general surgery. Patient underwent ERCP with sphincterectomy repeatedon 11/25/18 due to rising LFTs. CBD stoneswere then cleared at that time.He was again evaluated in the emergency department on 5/24 for periumbilical abdominal pain. At that time, his liver enzymes were elevated. His abdominal pain improved while in the emergency department and was discharged home. He then returned to ED again on 01/12/2019 with recurrent abdominal pain which started night before his presentation at around 9 PM.   Denied fevers, chills, night sweats, chest pain, shortness of breath or cough. No diarrhea.  He was once again admitted to hospital service for possible choledocholithiasis.  GI was consulted.  The plan was to do ERCP but before if an ERCP was done, patient's LFTs started to improve and his abdominal pain disappeared.  ERCP was done and there was some sludge in CBD was found which was removed and is a sphincterotomy  was dilated little more.  For some reason, patient had significant leukocytosis before he had repeat ERCP but he remained afebrile and in fact his symptoms improved.  Today his white cells again improved and he remains afebrile.  He has been cleared by GI to be discharged home and his LFTs are improving as well.  I and Dr. Matthias Hughs from GI both agree that patient does not have any indication to be on any antibiotics and that his leukocytosis could be nonspecific finding.  They are going to set up outpatient follow-up with GI in 2 weeks.  Discharge Diagnoses:  Principal Problem:   Abdominal pain Active Problems:   Elevated LFTs   Choledocholithiasis   Hypokalemia    Discharge Instructions  Discharge Instructions    Discharge patient   Complete by:  As directed    Discharge disposition:  01-Home or Self Care   Discharge patient date:  01/14/2019     Allergies as of 01/14/2019      Reactions   Other Anaphylaxis   Nuts   Shellfish Allergy Anaphylaxis      Medication List    TAKE these medications   albuterol 108 (90 Base) MCG/ACT inhaler Commonly known as:  VENTOLIN HFA Inhale 2 puffs into the lungs every 6 (six) hours as needed for wheezing or shortness of breath.   ferrous sulfate 325 (65 FE) MG tablet Take 325 mg by mouth daily.   loratadine 10 MG tablet Commonly known as:  CLARITIN Take 10 mg by mouth daily as needed for allergies.   traMADol 50 MG tablet Commonly known as:  ULTRAM Take 50 mg by mouth every 6 (six) hours as needed for moderate pain.       Allergies  Allergen Reactions  . Other Anaphylaxis    Nuts  . Shellfish Allergy Anaphylaxis    Consultations: GI   Procedures/Studies: Ct Abdomen Pelvis W Contrast  Result Date: 01/12/2019 CLINICAL DATA:  Abdominal pain and vomiting. Prior cholecystectomy and ERCP in April. EXAM: CT ABDOMEN AND PELVIS WITH CONTRAST TECHNIQUE: Multidetector CT imaging of the abdomen and pelvis was performed using the  standard protocol following bolus administration of intravenous contrast. CONTRAST:  OMNIPAQUE IOHEXOL 300 MG/ML  SOLN COMPARISON:  Abdominal ultrasound from same day. MRCP and CT abdomen pelvis dated November 20, 2018. FINDINGS: Lower chest: No acute abnormality. Bibasilar subsegmental atelectasis. Hepatobiliary: No focal liver abnormality. Status post cholecystectomy. Mild central intrahepatic biliary dilatation. The common bile duct is normal in caliber. Pancreas: Unremarkable. No pancreatic ductal dilatation or surrounding inflammatory changes. Spleen: Normal in size without focal abnormality. Adrenals/Urinary Tract: Adrenal glands are unremarkable. Kidneys are normal, without renal calculi, focal lesion, or hydronephrosis. Bladder is unremarkable. Stomach/Bowel: Stomach is within normal limits. Appendix appears normal. No evidence of bowel wall thickening, distention, or inflammatory changes. Vascular/Lymphatic: No significant vascular findings are present. No enlarged abdominal or pelvic lymph nodes. Reproductive: Prostate is unremarkable. Other: No abdominal wall hernia or abnormality. No abdominopelvic ascites. No pneumoperitoneum. Musculoskeletal: No acute or significant osseous findings. IMPRESSION: 1. Mild central intrahepatic biliary dilatation is nonspecific but could be related to post cholecystectomy state. The common bile duct is within normal limits. 2. Otherwise unremarkable CT of the abdomen and pelvis. Electronically Signed   By: Obie Dredge M.D.   On: 01/12/2019 14:15   Dg Ercp Biliary & Pancreatic Ducts  Result Date: 01/13/2019 CLINICAL DATA:  43 year old male with choledocholithiasis EXAM: ERCP TECHNIQUE: Multiple spot images obtained with the fluoroscopic device and submitted for interpretation post-procedure. FLUOROSCOPY TIME:  Fluoroscopy Time:  6 minutes 18 seconds COMPARISON:  None. FINDINGS: Multiple intraoperative fluoroscopic spot images of ERCP. Initial image demonstrates  endoscope projecting over the upper abdomen with cannulation of the ampulla and partial opacification of the extrahepatic biliary ducts. Multiple filling defects within the common bile duct, potentially air bubbles, stones/debris. Images during the case demonstrate deployment of a retrieval balloon. IMPRESSION: Limited intraoperative fluoroscopic spot images during ERCP demonstrates treatment of choledocholithiasis with deployment of a retrieval balloon. Please refer to the dictated operative report for full details of intraoperative findings and procedure. Electronically Signed   By: Gilmer Mor D.O.   On: 01/13/2019 14:03   US Abdomen Limited Ruq  Result Date: 01/12/2019 CLINICAL DATA:  Constant upper abdominal pain since vomiting 6 days ago. Elevated liver function studies. Cholecystectomy 11/23/2018 with sphincterotomy 11/25/2018. EXAM: ULTRASOUND ABDOMEN LIMITED RIGHT UPPER QUADRANT COMPARISON:  None. FINDINGS: Gallbladder: Surgically absent.  No focal right upper quadrant abdominal pain. Common bile duct: Diameter: 6 mm.  No evidence of choledocholithiasis. Liver: No focal lesion identified. Within normal limits in parenchymal echogenicity. Portal vein is patent on color Doppler imaging with normal direction of blood flow towards the liver. IMPRESSION: No acute findings or biliary dilatation identified post cholecystectomy. Electronically Signed   By: Carey Bullocks M.D.   On: 01/12/2019 11:59      Discharge Exam: Vitals:   01/13/19 2022 01/14/19 0523  BP: 124/73 122/78  Pulse: 87 72  Resp: (!) 23 (!) 21  Temp: 98.8 F (37.1 C) 98.2 F (36.8 C)  SpO2: 96% 96%   Vitals:   01/13/19 1435 01/13/19 1505 01/13/19 2022 01/14/19 0523  BP:  132/82 124/73 122/78  Pulse: 92 73 87 72  Resp: (!) 29 (!) 21 (!) 23 (!) 21  Temp:  98.6 F (37 C) 98.8 F (37.1 C) 98.2 F (36.8 C)  TempSrc:   Oral Oral  SpO2: 97% 95% 96% 96%  Weight:      Height:        General: Pt is alert, awake, not in  acute distress Cardiovascular: RRR, S1/S2 +, no rubs, no gallops Respiratory: CTA bilaterally, no wheezing, no rhonchi Abdominal: Soft, NT, ND, bowel sounds + Extremities: no edema, no cyanosis    The results of significant diagnostics from this hospitalization (including imaging, microbiology, ancillary and laboratory) are listed below for reference.     Microbiology: Recent Results (from the past 240 hour(s))  SARS Coronavirus 2 (CEPHEID - Performed in [facility] hospital lab), Hosp Order     Status: None   Collection Time: 01/12/19 10:39 AM  Result Value Ref Range Status   SARS Coronavirus 2 NEGATIVE NEGATIVE Final    Comment: (NOTE) If result is NEGATIVE SARS-CoV-2 target nucleic acids are NOT DETECTED. The SARS-CoV-2 RNA is generally detectable in upper and lower  respiratory specimens during the acute phase of infection. The lowest  concentration of SARS-CoV-2 viral copies this assay can detect is 250  copies / mL. A negative result does not preclude SARS-CoV-2 infection  and should not be used as the sole basis for treatment or other  patient management decisions.  A negative result may occur with  improper specimen collection / handling, submission of specimen other  than nasopharyngeal swab, presence of viral mutation(s) within the  areas targeted by this assay, and inadequate number of viral copies  (<250 copies / mL). A negative result must be combined with clinical  observations, patient history, and epidemiological information. If result is POSITIVE SARS-CoV-2 target nucleic acids are DETECTED. The SARS-CoV-2 RNA is generally detectable in upper and lower  respiratory specimens dur ing the acute phase of infection.  Positive  results are indicative of active infection with SARS-CoV-2.  Clinical  correlation with patient history and other diagnostic information is  necessary to determine patient infection status.  Positive results do  not rule out bacterial  infection or co-infection with other viruses. If result is PRESUMPTIVE POSTIVE SARS-CoV-2 nucleic acids MAY BE PRESENT.   A presumptive positive result was obtained on the submitted specimen  and confirmed on repeat testing.  While 2019 novel coronavirus  (SARS-CoV-2) nucleic acids may be present in the submitted sample  additional confirmatory testing may be necessary for epidemiological  and / or clinical management purposes  to differentiate between  SARS-CoV-2 and other Sarbecovirus currently known to infect humans.  If clinically indicated additional testing with an alternate test  methodology [phone]([id number]) is advised. The SARS-CoV-2 RNA is generally  detectable in upper and lower respiratory sp ecimens during the acute  phase of infection. The expected result is Negative. Fact Sheet for Patients:  BoilerBrush.com.cyhttps://www.fda.gov/media/136312/download Fact Sheet for Healthcare Providers: https://pope.com/https://www.fda.gov/media/136313/download This test is not yet approved or cleared by the [place] FDA and has been authorized for detection and/or diagnosis of SARS-CoV-2 by FDA under an Emergency Use Authorization (EUA).  This EUA will remain in effect (meaning this test can be used) for the duration of the COVID-19 declaration under Section 564(b)(1) of the Act, 21 U.S.C. section 360bbb-3(b)(1), unless the authorization is terminated or revoked sooner. Performed at [facility], [address] [place], [place] [address]      Labs: BNP (last 3 results) No results for  input(s): BNP in the last 8760 hours. Basic Metabolic Panel: Recent Labs  Lab 01/10/19 1744 01/12/19 0923 01/13/19 0638 01/14/19 0607  NA 139 139 138 138  K 3.4* 3.2* 3.9 3.9  CL 107 105 107 106  CO2 25 24 23 23   GLUCOSE 121* 150* 114* 104*  BUN 9 12 12 10   CREATININE 0.85 0.84 0.80 0.98  CALCIUM 9.0 9.3 8.7* 8.9   Liver Function Tests: Recent Labs  Lab 01/10/19 1744 01/12/19 0923 01/13/19 0638  01/14/19 0607  AST 277* 384* 181* 93*  ALT 231* 530* 380* 261*  ALKPHOS 116 161* 139* 139*  BILITOT 1.0 1.6* 3.5* 1.1  PROT 7.7 8.5* 7.0 6.9  ALBUMIN 4.1 4.7 3.8 3.4*   Recent Labs  Lab 01/10/19 1744 01/12/19 0923  LIPASE 42 27   No results for input(s): AMMONIA in the last 168 hours. CBC: Recent Labs  Lab 01/10/19 1744 01/12/19 0923 01/13/19 0638 01/14/19 0607  WBC 10.2 7.4 24.5* 16.3*  NEUTROABS  --  4.8  --   --   HGB 12.9* 13.9 11.7* 12.3*  HCT 42.3 45.7 38.4* 39.8  MCV 82.3 80.6 81.7 81.4  PLT 222 246 195 197   Cardiac Enzymes: No results for input(s): CKTOTAL, CKMB, CKMBINDEX, TROPONINI in the last 168 hours. BNP: Invalid input(s): POCBNP CBG: No results for input(s): GLUCAP in the last 168 hours. D-Dimer No results for input(s): DDIMER in the last 72 hours. Hgb A1c No results for input(s): HGBA1C in the last 72 hours. Lipid Profile No results for input(s): CHOL, HDL, LDLCALC, TRIG, CHOLHDL, LDLDIRECT in the last 72 hours. Thyroid function studies No results for input(s): TSH, T4TOTAL, T3FREE, THYROIDAB in the last 72 hours.  Invalid input(s): FREET3 Anemia work up No results for input(s): VITAMINB12, FOLATE, FERRITIN, TIBC, IRON, RETICCTPCT in the last 72 hours. Urinalysis    Component Value Date/Time   COLORURINE YELLOW 01/10/2019 1744   APPEARANCEUR CLEAR 01/10/2019 1744   LABSPEC 1.014 01/10/2019 1744   PHURINE 8.0 01/10/2019 1744   GLUCOSEU NEGATIVE 01/10/2019 1744   HGBUR NEGATIVE 01/10/2019 1744   BILIRUBINUR NEGATIVE 01/10/2019 1744   KETONESUR NEGATIVE 01/10/2019 1744   PROTEINUR NEGATIVE 01/10/2019 1744   NITRITE NEGATIVE 01/10/2019 1744   LEUKOCYTESUR NEGATIVE 01/10/2019 1744   Sepsis Labs Invalid input(s): PROCALCITONIN,  WBC,  LACTICIDVEN Microbiology Recent Results (from the past 240 hour(s))  SARS Coronavirus 2 (CEPHEID - Performed in [facility] hospital lab), Hosp Order     Status: None   Collection Time: 01/12/19 10:39 AM   Result Value Ref Range Status   SARS Coronavirus 2 NEGATIVE NEGATIVE Final    Comment: (NOTE) If result is NEGATIVE SARS-CoV-2 target nucleic acids are NOT DETECTED. The SARS-CoV-2 RNA is generally detectable in upper and lower  respiratory specimens during the acute phase of infection. The lowest  concentration of SARS-CoV-2 viral copies this assay can detect is 250  copies / mL. A negative result does not preclude SARS-CoV-2 infection  and should not be used as the sole basis for treatment or other  patient management decisions.  A negative result may occur with  improper specimen collection / handling, submission of specimen other  than nasopharyngeal swab, presence of viral mutation(s) within the  areas targeted by this assay, and inadequate number of viral copies  (<250 copies / mL). A negative result must be combined with clinical  observations, patient history, and epidemiological information. If result is POSITIVE SARS-CoV-2 target nucleic acids are DETECTED. The SARS-CoV-2 RNA  is generally detectable in upper and lower  respiratory specimens dur ing the acute phase of infection.  Positive  results are indicative of active infection with SARS-CoV-2.  Clinical  correlation with patient history and other diagnostic information is  necessary to determine patient infection status.  Positive results do  not rule out bacterial infection or co-infection with other viruses. If result is PRESUMPTIVE POSTIVE SARS-CoV-2 nucleic acids MAY BE PRESENT.   A presumptive positive result was obtained on the submitted specimen  and confirmed on repeat testing.  While 2019 novel coronavirus  (SARS-CoV-2) nucleic acids may be present in the submitted sample  additional confirmatory testing may be necessary for epidemiological  and / or clinical management purposes  to differentiate between  SARS-CoV-2 and other Sarbecovirus currently known to infect humans.  If clinically indicated additional  testing with an alternate test  methodology [phone]) is advised. The SARS-CoV-2 RNA is generally  detectable in upper and lower respiratory sp ecimens during the acute  phase of infection. The expected result is Negative. Fact Sheet for Patients:  BoilerBrush.com.cy Fact Sheet for Healthcare Providers: https://pope.com/ This test is not yet approved or cleared by the [place] FDA and has been authorized for detection and/or diagnosis of SARS-CoV-2 by FDA under an Emergency Use Authorization (EUA).  This EUA will remain in effect (meaning this test can be used) for the duration of the COVID-19 declaration under Section 564(b)(1) of the Act, 21 U.S.C. section 360bbb-3(b)(1), unless the authorization is terminated or revoked sooner. Performed at [facility], [address], [place], [place] [address]      Time coordinating discharge: 35 minutes  SIGNED:   Hughie Closs, MD  [facility] 01/14/2019, 11:59 AM Pager [phone]  If 7PM-7AM, please contact night-coverage [email or web address] Password [email or web address]

## 2019-01-14 NOTE — Progress Notes (Signed)
Pt discharged home in stable condition. Discharge instructions given. No immediate questions or concerns at this time. Pt opted to ambulate off the unit.

## 2019-01-14 NOTE — Progress Notes (Signed)
Vital signs stable following yesterday's ERCP with extension of his sphincterotomy and removal of some sludge.  The patient is feeling well today.  He had a solid diet for dinner last night and again breakfast this morning, with no adverse symptoms.  No abdominal pain.  Liver chemistries have dropped about 50% overnight (AST 93, ALT 261, bilirubin 1.1) and his white count has dropped substantially, from 24,000 to 16,000.  I reviewed the ERCP findings with the patient.  On exam, he is sitting up in bed in no distress whatsoever.  Abdomen is soft and nontender.  Impression: Benign post ERCP course.  I suspect that he may have passed a stone prior to admission, since no discrete stone was retrieved at the time of his ERCP, and his chemistries were already coming down.  Recommendations: (Discussed with Dr.Pahwani)  1.  Okay for discharge from GI standpoint.  Okay for regular diet when he is home.  No new medications are needed from our standpoint. 2.  Since the patient's white count is coming down, he is without fever, and has not had any documented infection, and since the biliary tree has been cleared, I do not think that there is any need to continue antibiotics post discharge 3.  I have made the patient a follow-up appointment with Dr. Ewing Schlein for June 11 at 9:15 AM.  I suspect that repeat liver chemistries will be obtained prior to that visit. 4.  I will sign off but please call me if you have any questions.  Florencia Reasons, M.D. Pager [phone] If no answer or after 5 PM call [phone]

## 2019-01-14 NOTE — Discharge Instructions (Signed)
Biliary Colic, Adult  Biliary colic is severe pain caused by a problem with a small organ in the upper right part of your belly (gallbladder). The gallbladder stores a digestive fluid produced in the liver (bile) that helps the body break down fat. Bile and other digestive enzymes are carried from the liver to the small intestine through tube-like structures (bile ducts). The gallbladder and the bile ducts form the biliary tract. Sometimes hard deposits of digestive fluids form in the gallbladder (gallstones) and block the flow of bile from the gallbladder, causing biliary colic. This condition is also called a gallbladder attack. Gallstones can be as small as a grain of sand or as big as a golf ball. There could be just one gallstone in the gallbladder, or there could be many. What are the causes? Biliary colic is usually caused by gallstones. Less often, a tumor could block the flow of bile from the gallbladder and trigger biliary colic. What increases the risk? This condition is more likely to develop in:  Women.  People of Hispanic descent.  People with a family history of gallstones.  People who are obese.  People who suddenly or quickly lose weight.  People who eat a high-calorie, low-fiber diet that is rich in refined carbs (carbohydrates), such as white bread and white rice.  People who have an intestinal disease that affects nutrient absorption, such as Crohn disease.  People who have a metabolic condition, such as metabolic syndrome or diabetes. What are the signs or symptoms? Severe pain in the upper right side of the belly is the main symptom of biliary colic. You may feel this pain below the chest but above the hip. This pain often occurs at night or after eating a very fatty meal. This pain may get worse for up to an hour and last as long as 12 hours. In most cases, the pain fades (subsides) within a couple hours. Other symptoms of this condition include:  Nausea and  vomiting.  Pain under the right shoulder. How is this diagnosed? This condition is diagnosed based on your medical history, your symptoms, and a physical exam. You may have tests, including:  Blood tests to rule out infection or inflammation of the bile ducts, gallbladder, pancreas, or liver.  Imaging studies such as: ? Ultrasound. ? CT scan. ? MRI. In some cases, you may need to have an imaging study done using a small amount of radioactive material (nuclear medicine) to confirm the diagnosis. How is this treated? Treatment for this condition may include medicine to relieve your pain or nausea. If you have gallstones that are causing biliary colic, you may need surgery to remove the gallbladder (cholecystectomy). Gallstones can also be dissolved gradually with medicine. It may take months or years before the gallstones are completely gone. Follow these instructions at home:  Take over-the-counter and prescription medicines only as told by your health care provider.  Drink enough fluid to keep your urine clear or pale yellow.  Follow instructions from your health care provider about eating or drinking restrictions. These may include avoiding: ? Fatty, greasy, and fried foods. ? Any foods that make the pain worse. ? Overeating. ? Having a large meal after not eating for a while.  Keep all follow-up visits as told by your health care provider. This is important. How is this prevented? Steps to prevent this condition include:  Maintaining a healthy body weight.  Getting regular exercise.  Eating a healthy, high-fiber, low-fat diet.  Limiting how much   sugar and refined carbs you eat, such as sweets, white flour, and white rice. Contact a health care provider if:  Your pain lasts more than 5 hours.  You vomit.  You have a fever and chills.  Your pain gets worse. Get help right away if:  Your skin or the whites of your eyes look yellow (jaundice).  Your have tea-colored  urine and light-colored stools.  You are dizzy or you faint. Summary  Biliary colic is severe pain caused by a problem with a small organ in the upper right part of your belly (gallbladder).  Treatments for this condition include medicines that relieves your pain or nausea and medicines that slowly dissolves the gallstones.  If gallstones cause your biliary colic, the treatment is surgery to remove the gallbladder (cholecystectomy). This information is not intended to replace advice given to you by your health care provider. Make sure you discuss any questions you have with your health care provider. Document Released: 01/06/2006 Document Revised: 02/10/2017 Document Reviewed: 02/19/2016 Elsevier Interactive Patient Education  2019 [employer]  

## 2019-01-18 ENCOUNTER — Encounter (HOSPITAL_COMMUNITY): Payer: Self-pay | Admitting: Gastroenterology

## 2019-01-27 DIAGNOSIS — D508 Other iron deficiency anemias: Secondary | ICD-10-CM | POA: Diagnosis not present

## 2019-01-27 DIAGNOSIS — R748 Abnormal levels of other serum enzymes: Secondary | ICD-10-CM | POA: Diagnosis not present

## 2019-01-28 DIAGNOSIS — D508 Other iron deficiency anemias: Secondary | ICD-10-CM | POA: Diagnosis not present

## 2019-01-28 DIAGNOSIS — R748 Abnormal levels of other serum enzymes: Secondary | ICD-10-CM | POA: Diagnosis not present

## 2020-01-28 IMAGING — CT CT ABDOMEN AND PELVIS WITH CONTRAST
2 of 5 series · 16 of 46 positions shown, 18 images · IV contrast (ISOVUE)
Comparison: Abdominal ultrasound from same day. MRCP and CT abdomen
pelvis dated November 20, 2018.

CLINICAL DATA: Abdominal pain and vomiting. Prior cholecystectomy
and ERCP in [place].

EXAM:
CT ABDOMEN AND PELVIS WITH CONTRAST
TECHNIQUE: Multidetector CT imaging of the abdomen and pelvis was performed
using the standard protocol following bolus administration of
intravenous contrast.
CONTRAST:  100mL OMNIPAQUE IOHEXOL 300 MG/ML  SOLN

[Series 2: axial st · axial · 0.90mm/px · z∈[-562,-122]mm · 13 of 102 slices shown, 15 images]
[im 7/102  soft-tissue]
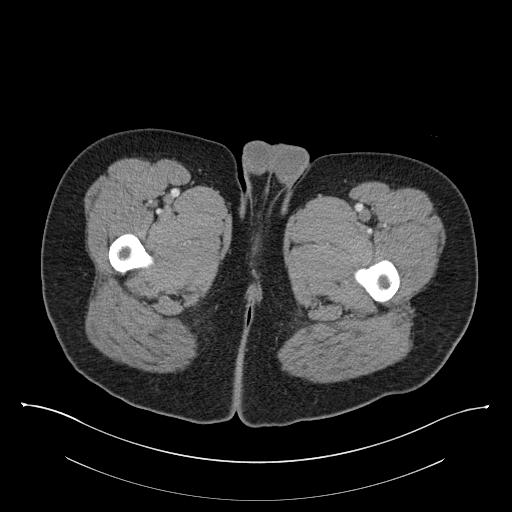
[im 7/102  bone]
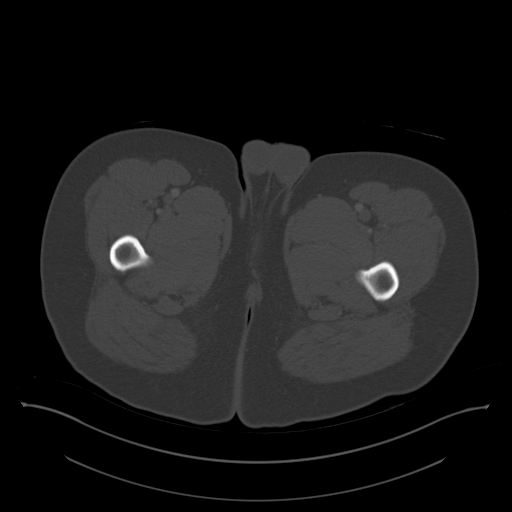
[im 13/102  soft-tissue]
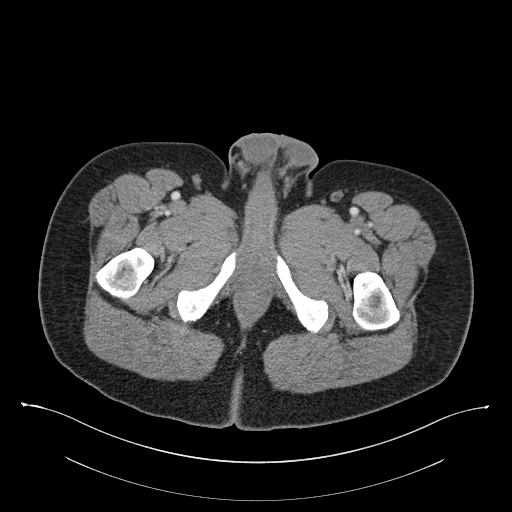
[im 19/102  soft-tissue]
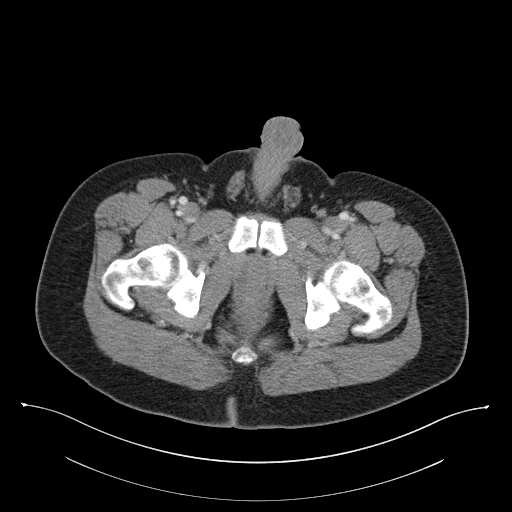
[im 32/102  soft-tissue]
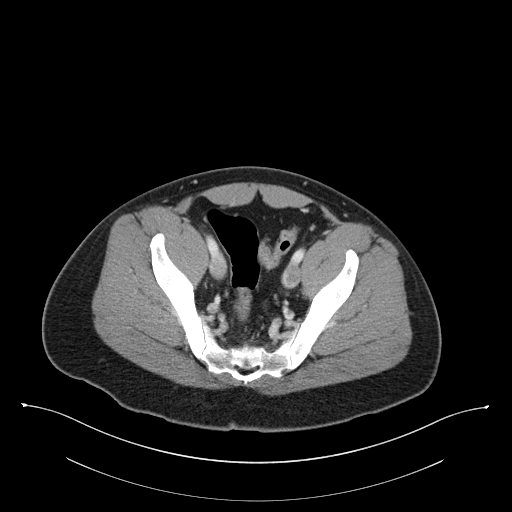
[im 38/102  soft-tissue]
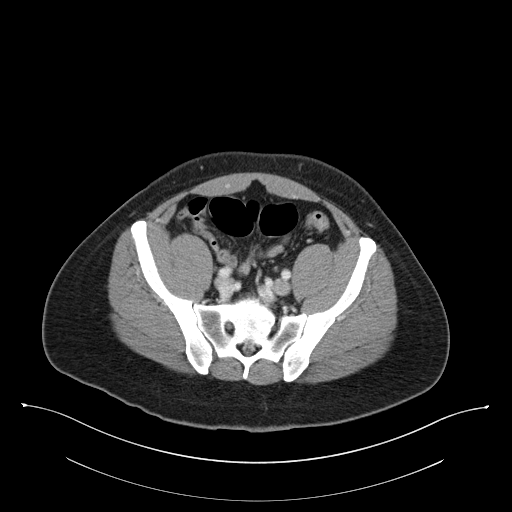
[im 45/102  soft-tissue]
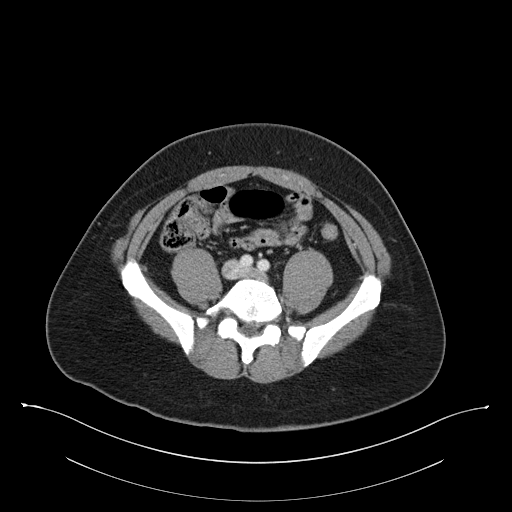
[im 51/102  soft-tissue]
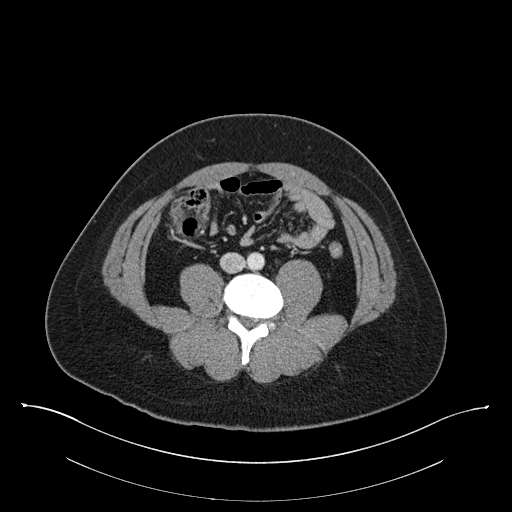
[im 57/102  soft-tissue]
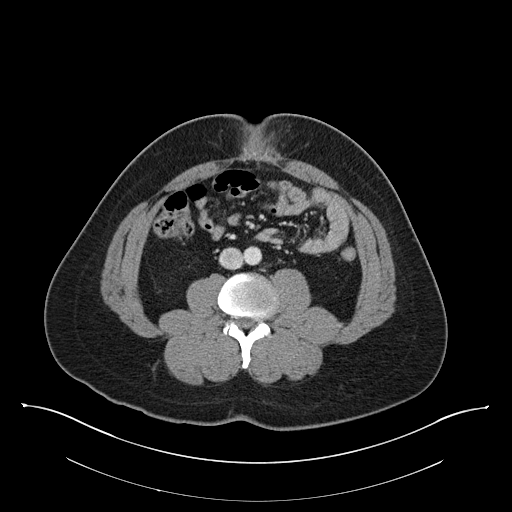
[im 64/102  soft-tissue]
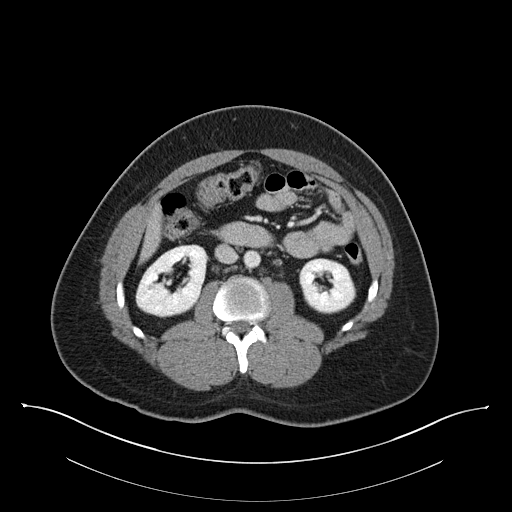
[im 64/102  bone]
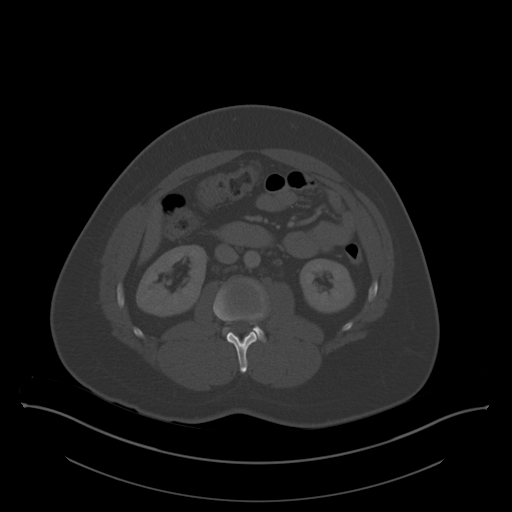
[im 70/102  soft-tissue]
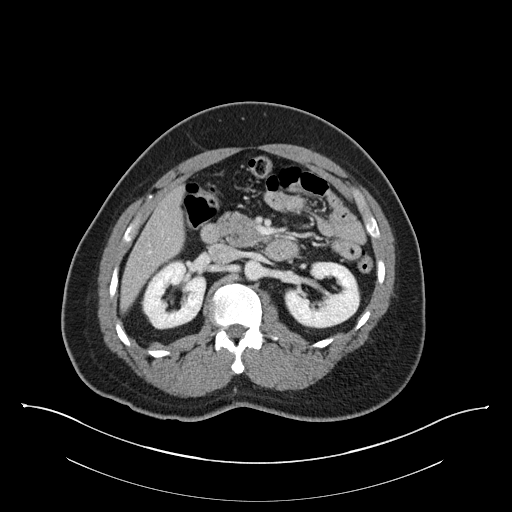
[im 83/102  soft-tissue]
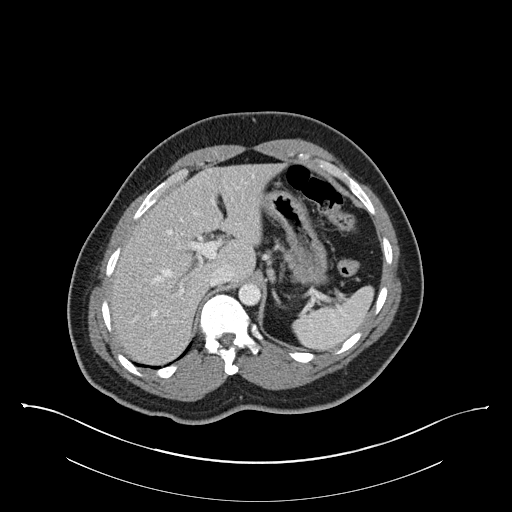
[im 89/102  soft-tissue]
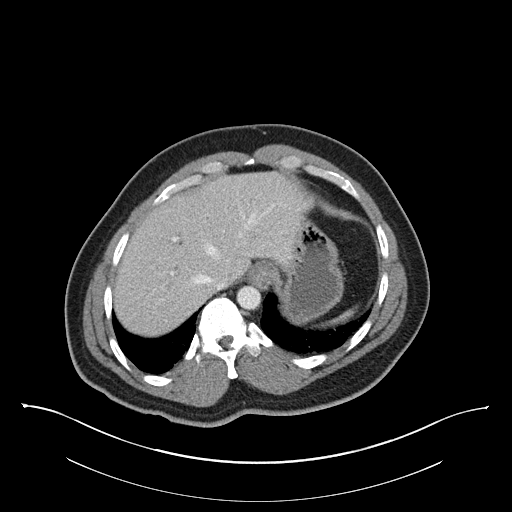
[im 95/102  soft-tissue]
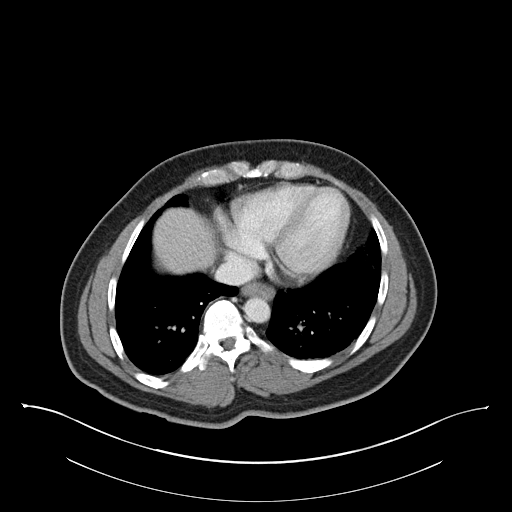

[Series 4: coronal st · coronal · 0.71mm/px · 3 of 152 slices shown]
[im 51/152  soft-tissue]
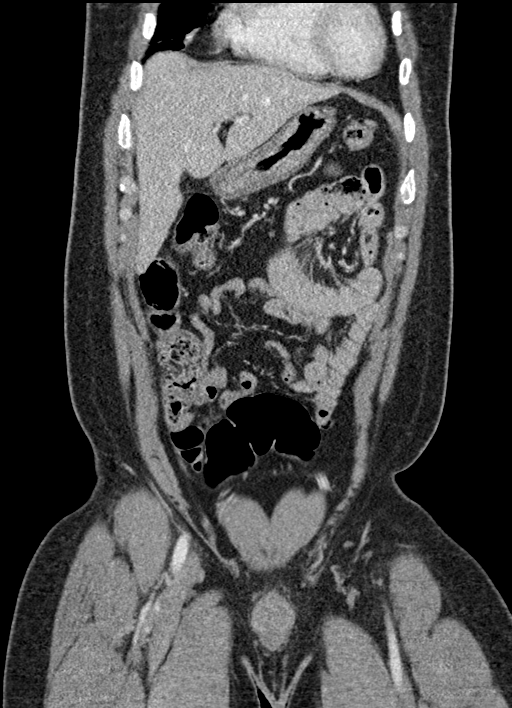
[im 68/152  soft-tissue]
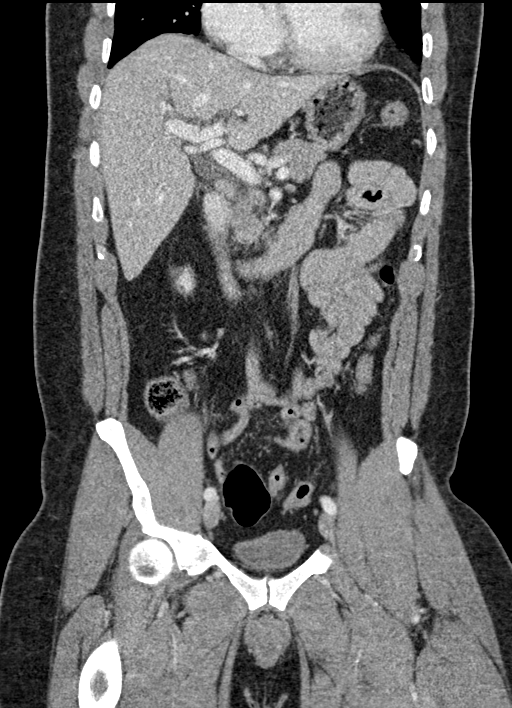
[im 84/152  soft-tissue]
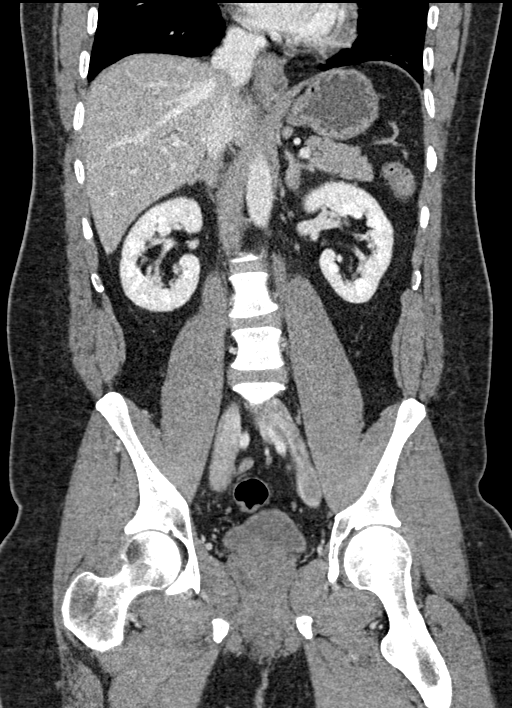

[16 of 46 positions shown; findings below may reference images not displayed]

FINDINGS: Lower chest: No acute abnormality. Bibasilar subsegmental
atelectasis.

Hepatobiliary: No focal liver abnormality. Status post
cholecystectomy. Mild central intrahepatic biliary dilatation. The
common bile duct is normal in caliber.

Pancreas: Unremarkable. No pancreatic ductal dilatation or
surrounding inflammatory changes.

Spleen: Normal in size without focal abnormality.

Adrenals/Urinary Tract: Adrenal glands are unremarkable. Kidneys are
normal, without renal calculi, focal lesion, or hydronephrosis.
Bladder is unremarkable.

Stomach/Bowel: Stomach is within normal limits. Appendix appears
normal. No evidence of bowel wall thickening, distention, or
inflammatory changes.

Vascular/Lymphatic: No significant vascular findings are present. No
enlarged abdominal or pelvic lymph nodes.

Reproductive: Prostate is unremarkable.

Other: No abdominal wall hernia or abnormality. No abdominopelvic
ascites. No pneumoperitoneum.

Musculoskeletal: No acute or significant osseous findings.
IMPRESSION: 1. Mild central intrahepatic biliary dilatation is nonspecific but
could be related to post cholecystectomy state. The common bile duct
is within normal limits.
2. Otherwise unremarkable CT of the abdomen and pelvis.

## 2021-03-27 DIAGNOSIS — S46911A Strain of unspecified muscle, fascia and tendon at shoulder and upper arm level, right arm, initial encounter: Secondary | ICD-10-CM | POA: Diagnosis not present

## 2021-03-27 DIAGNOSIS — R202 Paresthesia of skin: Secondary | ICD-10-CM | POA: Diagnosis not present

## 2021-03-27 DIAGNOSIS — S29012A Strain of muscle and tendon of back wall of thorax, initial encounter: Secondary | ICD-10-CM | POA: Diagnosis not present

## 2022-08-07 DIAGNOSIS — Z03818 Encounter for observation for suspected exposure to other biological agents ruled out: Secondary | ICD-10-CM | POA: Diagnosis not present

## 2022-08-07 DIAGNOSIS — B349 Viral infection, unspecified: Secondary | ICD-10-CM | POA: Diagnosis not present

## 2022-09-06 DIAGNOSIS — F331 Major depressive disorder, recurrent, moderate: Secondary | ICD-10-CM | POA: Diagnosis not present

## 2022-09-17 DIAGNOSIS — F331 Major depressive disorder, recurrent, moderate: Secondary | ICD-10-CM | POA: Diagnosis not present

## 2022-11-11 DIAGNOSIS — Z20822 Contact with and (suspected) exposure to covid-19: Secondary | ICD-10-CM | POA: Diagnosis not present

## 2022-11-11 DIAGNOSIS — J028 Acute pharyngitis due to other specified organisms: Secondary | ICD-10-CM | POA: Diagnosis not present

## 2023-07-22 ENCOUNTER — Encounter: Payer: Self-pay | Admitting: Emergency Medicine

## 2023-07-22 ENCOUNTER — Ambulatory Visit
Admission: EM | Admit: 2023-07-22 | Discharge: 2023-07-22 | Disposition: A | Discharge status: Home / Self Care | Payer: Medicaid Other | Attending: Physician Assistant | Admitting: Physician Assistant

## 2023-07-22 DIAGNOSIS — J45901 Unspecified asthma with (acute) exacerbation: Secondary | ICD-10-CM | POA: Diagnosis present

## 2023-07-22 DIAGNOSIS — R0602 Shortness of breath: Secondary | ICD-10-CM | POA: Diagnosis present

## 2023-07-22 DIAGNOSIS — J069 Acute upper respiratory infection, unspecified: Secondary | ICD-10-CM | POA: Insufficient documentation

## 2023-07-22 LAB — GROUP A STREP BY PCR: Group A Strep by PCR: NOT DETECTED

## 2023-07-22 LAB — SARS CORONAVIRUS 2 BY RT PCR: SARS Coronavirus 2 by RT PCR: NEGATIVE

## 2023-07-22 MED ORDER — PROMETHAZINE-DM 6.25-15 MG/5ML PO SYRP: 5.0000 mL | ORAL_SOLUTION | Freq: Four times a day (QID) | ORAL | 0 refills | Status: AC | PRN

## 2023-07-22 MED ORDER — PREDNISONE 20 MG PO TABS: 40.0000 mg | ORAL_TABLET | Freq: Every day | ORAL | 0 refills | Status: AC

## 2023-07-22 MED ORDER — ALBUTEROL SULFATE HFA 108 (90 BASE) MCG/ACT IN AERS: 1.0000 | INHALATION_SPRAY | Freq: Four times a day (QID) | RESPIRATORY_TRACT | 0 refills | Status: AC | PRN

## 2023-07-22 NOTE — Discharge Instructions (Signed)
URI/COLD SYMPTOMS: Your exam today is consistent with a viral illness. Antibiotics are not indicated at this time. Use medications as directed, including cough syrup, nasal saline, and decongestants. Your symptoms should improve over the next few days and resolve within 7-10 days. Increase rest and fluids. F/u if symptoms worsen or predominate such as sore throat, ear pain, productive cough, shortness of breath, or if you develop high fevers or worsening fatigue over the next several days.    -Advised returning for fever, worsening cough, increased shortness of breath or weakness.

## 2023-07-22 NOTE — ED Triage Notes (Signed)
Pt presents with a fever, cough, sore throat x 3 days. Pt states his daughter was diagnosed with pneumonia last week.

## 2023-07-22 NOTE — ED Provider Notes (Signed)
[facility]    CSN: [id number] Arrival date & time: 07/22/23  1215      History   Chief Complaint Chief Complaint  Patient presents with   Sore Throat   Fever   Nasal Congestion    HPI Alejandro Willis is a 47 y.o. male presenting for self-reported fever up to 99.5 degrees, cough, congestion, sore throat x 3 days.  Denies ear pain, sinus pain or chest pain.  Has had some shortness of breath which is out of the ordinary for him.  He has been around a couple of sick contacts including his daughter who was diagnosed with pneumonia last week.  Has taken OTC meds.  No history of COPD.  He does have history of asthma and does not have an inhaler.  HPI  History reviewed. No pertinent past medical history.  Patient Active Problem List   Diagnosis Date Noted   Abdominal pain 01/12/2019   Hypokalemia 01/12/2019   Nausea without vomiting 11/25/2018   Acute on chronic cholecystitis s/p lap cholecystectomy 11/23/2018 11/23/2018   Asthma 11/23/2018   Transaminitis 11/21/2018   Elevated LFTs 11/20/2018   Choledocholithiasis 11/20/2018    Past Surgical History:  Procedure Laterality Date   BILIARY DILATION  11/25/2018   Procedure: BILIARY DILATION;  Surgeon: Vida Rigger, MD;  Location: [facility];  Service: Endoscopy;;   CHOLECYSTECTOMY N/A 11/23/2018   Procedure: LAPAROSCOPIC CHOLECYSTECTOMY WITH INTRAOPERATIVE CHOLANGIOGRAM;  Surgeon: Karie Soda, MD;  Location: [facility];  Service: General;  Laterality: N/A;   ENDOSCOPIC RETROGRADE CHOLANGIOPANCREATOGRAPHY (ERCP) WITH PROPOFOL N/A 11/25/2018   Procedure: ENDOSCOPIC RETROGRADE CHOLANGIOPANCREATOGRAPHY (ERCP) WITH PROPOFOL;  Surgeon: Vida Rigger, MD;  Location: [facility];  Service: Endoscopy;  Laterality: N/A;   ERCP N/A 11/23/2018   Procedure: ENDOSCOPIC RETROGRADE CHOLANGIOPANCREATOGRAPHY (ERCP);  Surgeon: Vida Rigger, MD;  Location: [facility];  Service: Endoscopy;  Laterality: N/A;   ERCP N/A 01/13/2019   Procedure: ENDOSCOPIC  RETROGRADE CHOLANGIOPANCREATOGRAPHY (ERCP);  Surgeon: Vida Rigger, MD;  Location: [facility];  Service: Endoscopy;  Laterality: N/A;   PANCREATIC STENT PLACEMENT  11/23/2018   Procedure: PANCREATIC STENT PLACEMENT;  Surgeon: Karie Soda, MD;  Location: [facility];  Service: General;;   REMOVAL OF STONES  11/23/2018   Procedure: REMOVAL OF STONES;  Surgeon: Karie Soda, MD;  Location: [facility];  Service: General;;   REMOVAL OF STONES  11/25/2018   Procedure: REMOVAL OF STONES;  Surgeon: Vida Rigger, MD;  Location: [facility];  Service: Endoscopy;;   SPHINCTEROTOMY  11/23/2018   Procedure: Dennison Mascot;  Surgeon: Karie Soda, MD;  Location: [facility];  Service: General;;   SPHINCTEROTOMY  11/25/2018   Procedure: Dennison Mascot;  Surgeon: Vida Rigger, MD;  Location: [facility];  Service: Endoscopy;;   SPHINCTEROTOMY  01/13/2019   Procedure: Dennison Mascot;  Surgeon: Vida Rigger, MD;  Location: [facility];  Service: Endoscopy;;  balloon sweep   STENT REMOVAL  11/25/2018   Procedure: STENT REMOVAL;  Surgeon: Vida Rigger, MD;  Location: [facility];  Service: Endoscopy;;       Home Medications    Prior to Admission medications   Medication Sig Start Date End Date Taking? Authorizing Provider  albuterol (VENTOLIN HFA) 108 (90 Base) MCG/ACT inhaler Inhale 1-2 puffs into the lungs every 6 (six) hours as needed for wheezing or shortness of breath. 07/22/23  Yes Shirlee Latch, PA-C  predniSONE (DELTASONE) 20 MG tablet Take 2 tablets (40 mg total) by mouth daily for 5 days. 07/22/23 07/27/23 Yes Shirlee Latch, PA-C  promethazine-dextromethorphan (PROMETHAZINE-DM)  6.25-15 MG/5ML syrup Take 5 mLs by mouth 4 (four) times daily as needed. 07/22/23  Yes Eusebio Friendly B, PA-C  ferrous sulfate 325 (65 FE) MG tablet Take 325 mg by mouth daily.    [provider]  loratadine (CLARITIN) 10 MG tablet Take 10 mg by mouth daily as needed for allergies.     [provider]  traMADol (ULTRAM) 50 MG  tablet Take 50 mg by mouth every 6 (six) hours as needed for moderate pain.    [provider]    Family History Family History  Problem Relation Age of Onset   Asthma Sister     Social History Social History   Tobacco Use   Smoking status: Never   Smokeless tobacco: Never  Vaping Use   Vaping status: Never Used  Substance Use Topics   Alcohol use: No   Drug use: Yes    Frequency: 7.0 times per week    Types: Marijuana     Allergies   Other and Shellfish allergy   Review of Systems Review of Systems  Constitutional:  Positive for fatigue. Negative for fever.  HENT:  Positive for congestion, rhinorrhea and sore throat. Negative for sinus pressure and sinus pain.   Respiratory:  Positive for cough and shortness of breath.   Cardiovascular:  Negative for chest pain.  Gastrointestinal:  Negative for abdominal pain, diarrhea, nausea and vomiting.  Musculoskeletal:  Negative for myalgias.  Neurological:  Negative for weakness, light-headedness and headaches.  Hematological:  Negative for adenopathy.     Physical Exam Triage Vital Signs ED Triage Vitals  Encounter Vitals Group     BP      Systolic BP Percentile      Diastolic BP Percentile      Pulse      Resp      Temp      Temp src      SpO2      Weight      Height      Head Circumference      Peak Flow      Pain Score      Pain Loc      Pain Education      Exclude from Growth Chart    No data found.  Updated Vital Signs BP 112/77 (BP Location: Right Arm)   Pulse 88   Temp 98.5 F (36.9 C) (Oral)   Resp 20   SpO2 95%    Physical Exam Vitals and nursing note reviewed.  Constitutional:      General: He is not in acute distress.    Appearance: Normal appearance. He is well-developed. He is not ill-appearing.  HENT:     Head: Normocephalic and atraumatic.     Nose: Congestion present.     Mouth/Throat:     Mouth: Mucous membranes are moist.     Pharynx: Oropharynx is clear. Posterior  oropharyngeal erythema present.  Eyes:     General: No scleral icterus.    Conjunctiva/sclera: Conjunctivae normal.  Cardiovascular:     Rate and Rhythm: Normal rate and regular rhythm.     Heart sounds: Normal heart sounds.  Pulmonary:     Effort: Pulmonary effort is normal. No respiratory distress.     Breath sounds: Normal breath sounds.  Musculoskeletal:     Cervical back: Neck supple.  Skin:    General: Skin is warm and dry.     Capillary Refill: Capillary refill takes less than 2 seconds.  Neurological:     General: No focal deficit present.     Mental Status: He is alert. Mental status is at baseline.     Motor: No weakness.     Gait: Gait normal.  Psychiatric:        Mood and Affect: Mood normal.        Behavior: Behavior normal.      UC Treatments / Results  Labs (all labs ordered are listed, but only abnormal results are displayed) Labs Reviewed  GROUP A STREP BY PCR  SARS CORONAVIRUS 2 BY RT PCR    EKG   Radiology No results found.  Procedures Procedures (including critical care time)  Medications Ordered in UC Medications - No data to display  Initial Impression / Assessment and Plan / UC Course  I have reviewed the triage vital signs and the nursing notes.  Pertinent labs & imaging results that were available during my care of the patient were reviewed by me and considered in my medical decision making (see chart for details).   47 year old male presents for low-grade temps of 98.5 degrees, cough, congestion, sore throat and shortness of breath x 3 days.  Vitals normal and stable.  Patient overall well-appearing.  No acute distress.  On exam has mild nasal congestion and erythema posterior pharynx.  Chest clear.  Coughs frequently.  Strep and COVID testing obtained.  Negative.  Viral illness.  Mild asthma exacerbation. Supportive care encouraged.  Sent Promethazine DM to pharmacy. Also sent prednisone and Proair. Advised to use of inhaler as  needed for shortness of breath.  Advised returning for fever, worsening cough, increased shortness of breath or weakness.   Final Clinical Impressions(s) / UC Diagnoses   Final diagnoses:  Viral URI with cough  Asthma with acute exacerbation, unspecified asthma severity, unspecified whether persistent  Shortness of breath     Discharge Instructions      URI/COLD SYMPTOMS: Your exam today is consistent with a viral illness. Antibiotics are not indicated at this time. Use medications as directed, including cough syrup, nasal saline, and decongestants. Your symptoms should improve over the next few days and resolve within 7-10 days. Increase rest and fluids. F/u if symptoms worsen or predominate such as sore throat, ear pain, productive cough, shortness of breath, or if you develop high fevers or worsening fatigue over the next several days.    -Advised returning for fever, worsening cough, increased shortness of breath or weakness.     ED Prescriptions     Medication Sig Dispense Auth. Provider   promethazine-dextromethorphan (PROMETHAZINE-DM) 6.25-15 MG/5ML syrup Take 5 mLs by mouth 4 (four) times daily as needed. 118 mL Eusebio Friendly B, PA-C   albuterol (VENTOLIN HFA) 108 (90 Base) MCG/ACT inhaler Inhale 1-2 puffs into the lungs every 6 (six) hours as needed for wheezing or shortness of breath. 1 g Eusebio Friendly B, PA-C   predniSONE (DELTASONE) 20 MG tablet Take 2 tablets (40 mg total) by mouth daily for 5 days. 10 tablet Gareth Morgan      PDMP not reviewed this encounter.   Shirlee Latch, PA-C 07/22/23 1356

## 2024-02-15 ENCOUNTER — Ambulatory Visit
Admission: EM | Admit: 2024-02-15 | Discharge: 2024-02-15 | Disposition: A | Discharge status: Home / Self Care | Attending: Family Medicine | Admitting: Family Medicine

## 2024-02-15 DIAGNOSIS — H109 Unspecified conjunctivitis: Secondary | ICD-10-CM

## 2024-02-15 MED ORDER — POLYMYXIN B-TRIMETHOPRIM 10000-0.1 UNIT/ML-% OP SOLN: 2.0000 [drp] | OPHTHALMIC | 0 refills | Status: AC

## 2024-02-15 NOTE — Discharge Instructions (Signed)
 Stop by the pharmacy to pick up your antibiotic eye medication.  Follow up with your primary eyecare provider or [facility] if symptoms suddenly worsen or you have little improvement in your eye symptoms.

## 2024-02-15 NOTE — ED Provider Notes (Signed)
 [facility]    CSN: [id number] Arrival date & time: 02/15/24  0946      History   Chief Complaint Chief Complaint  Patient presents with   Eye Drainage    HPI HPI  Alejandro Willis is a 48 y.o. male.    Alejandro Willis presents for left eye redness with discharge over the past 2-3 days.    No treatments tried prior to arrival.  Alejandro Willis does not feel like anything is in his eye.  There are no pets at home.    Alejandro Willis does wear glasses but no contacts.  Alejandro Willis has not had any trouble seeing.  Alejandro Willis has otherwise been well and has no additional concerns today.     History reviewed. No pertinent past medical history.  Patient Active Problem List   Diagnosis Date Noted   Abdominal pain 01/12/2019   Hypokalemia 01/12/2019   Nausea without vomiting 11/25/2018   Acute on chronic cholecystitis s/p lap cholecystectomy 11/23/2018 11/23/2018   Asthma 11/23/2018   Transaminitis 11/21/2018   Elevated LFTs 11/20/2018   Choledocholithiasis 11/20/2018    Past Surgical History:  Procedure Laterality Date   BILIARY DILATION  11/25/2018   Procedure: BILIARY DILATION;  Surgeon: Rosalie Kitchens, MD;  Location: [facility];  Service: Endoscopy;;   CHOLECYSTECTOMY N/A 11/23/2018   Procedure: LAPAROSCOPIC CHOLECYSTECTOMY WITH INTRAOPERATIVE CHOLANGIOGRAM;  Surgeon: Sheldon Standing, MD;  Location: [facility];  Service: General;  Laterality: N/A;   ENDOSCOPIC RETROGRADE CHOLANGIOPANCREATOGRAPHY (ERCP) WITH PROPOFOL  N/A 11/25/2018   Procedure: ENDOSCOPIC RETROGRADE CHOLANGIOPANCREATOGRAPHY (ERCP) WITH PROPOFOL ;  Surgeon: Rosalie Kitchens, MD;  Location: [facility];  Service: Endoscopy;  Laterality: N/A;   ERCP N/A 11/23/2018   Procedure: ENDOSCOPIC RETROGRADE CHOLANGIOPANCREATOGRAPHY (ERCP);  Surgeon: Rosalie Kitchens, MD;  Location: [facility];  Service: Endoscopy;  Laterality: N/A;   ERCP N/A 01/13/2019   Procedure: ENDOSCOPIC RETROGRADE CHOLANGIOPANCREATOGRAPHY (ERCP);  Surgeon: Rosalie Kitchens, MD;  Location: [facility];  Service: Endoscopy;  Laterality: N/A;   PANCREATIC STENT PLACEMENT  11/23/2018   Procedure: PANCREATIC STENT PLACEMENT;  Surgeon: Sheldon Standing, MD;  Location: [facility];  Service: General;;   REMOVAL OF STONES  11/23/2018   Procedure: REMOVAL OF STONES;  Surgeon: Sheldon Standing, MD;  Location: [facility];  Service: General;;   REMOVAL OF STONES  11/25/2018   Procedure: REMOVAL OF STONES;  Surgeon: Rosalie Kitchens, MD;  Location: [facility];  Service: Endoscopy;;   SPHINCTEROTOMY  11/23/2018   Procedure: ANNETT;  Surgeon: Sheldon Standing, MD;  Location: [facility];  Service: General;;   SPHINCTEROTOMY  11/25/2018   Procedure: ANNETT;  Surgeon: Rosalie Kitchens, MD;  Location: [facility];  Service: Endoscopy;;   SPHINCTEROTOMY  01/13/2019   Procedure: ANNETT;  Surgeon: Rosalie Kitchens, MD;  Location: [facility];  Service: Endoscopy;;  balloon sweep   STENT REMOVAL  11/25/2018   Procedure: STENT REMOVAL;  Surgeon: Rosalie Kitchens, MD;  Location: [facility];  Service: Endoscopy;;       Home Medications    Prior to Admission medications   Medication Sig Start Date End Date Taking? Authorizing Provider  trimethoprim-polymyxin b (POLYTRIM) ophthalmic solution Place 2 drops into the right eye every 4 (four) hours. Use for 7 days. 02/15/24  Yes Krikor Willet, DO  albuterol  (VENTOLIN  HFA) 108 (90 Base) MCG/ACT inhaler Inhale 1-2 puffs into the lungs every 6 (six) hours as needed for wheezing or shortness of breath. 07/22/23   Arvis Huxley B, PA-C  ferrous sulfate 325 (65 FE) MG tablet Take 325 mg by mouth daily.  [provider]  loratadine  (CLARITIN ) 10 MG tablet Take 10 mg by mouth daily as needed for allergies.     [provider]  promethazine -dextromethorphan (PROMETHAZINE -DM) 6.25-15 MG/5ML syrup Take 5 mLs by mouth 4 (four) times daily as needed. 07/22/23   Arvis Jolan NOVAK, PA-C  traMADol  (ULTRAM ) 50 MG tablet Take 50 mg by mouth every 6 (six) hours as needed for  moderate pain.    [provider]    Family History Family History  Problem Relation Age of Onset   Asthma Sister     Social History Social History   Tobacco Use   Smoking status: Never   Smokeless tobacco: Never  Vaping Use   Vaping status: Never Used  Substance Use Topics   Alcohol use: No   Drug use: Yes    Frequency: 7.0 times per week    Types: Marijuana     Allergies   Other and Shellfish allergy   Review of Systems Review of Systems : negative unless otherwise stated in HPI.      Physical Exam Triage Vital Signs ED Triage Vitals [02/15/24 1002]  Encounter Vitals Group     BP 128/83     Girls Systolic BP Percentile      Girls Diastolic BP Percentile      Boys Systolic BP Percentile      Boys Diastolic BP Percentile      Pulse Rate 72     Resp 17     Temp 98.8 F (37.1 C)     Temp Source Oral     SpO2 96 %     Weight      Height      Head Circumference      Peak Flow      Pain Score 0     Pain Loc      Pain Education      Exclude from Growth Chart    No data found.  Updated Vital Signs BP 128/83 (BP Location: Right Arm)   Pulse 72   Temp 98.8 F (37.1 C) (Oral)   Resp 17   SpO2 96%   Visual Acuity Right Eye Distance:   Left Eye Distance:   Bilateral Distance:    Right Eye Near: R Near: 20/30 Left Eye Near:  L Near: 20/40 Bilateral Near:  20/25 (with correction)  Physical Exam  GEN: pleasant well appearing male, in no acute distress  NECK: normal ROM  CV: regular rate  RESP: no increased work of breathing EYES:     General: Lids are normal. Lids are everted, Vision grossly intact. Gaze aligned appropriately.        Right eye: No hordeolum or foreign body appreciated.  + mucoid discharge a the medial canthus        Left eye: No foreign body, discharge or hordeolum.     Extraocular Movements: Extraocular movements intact.     PERRLA     Conjunctiva/sclera:right conjunctiva is injected. No chemosis or  hemorrhage. SKIN: warm and dry   UC Treatments / Results  Labs (all labs ordered are listed, but only abnormal results are displayed) Labs Reviewed - No data to display  EKG   Radiology No results found.  Procedures Procedures (including critical care time) Visual Screen Right eye: 20/30 Left eye: 20/40 Both eyes : 20/25    With correction   Medications Ordered in UC Medications - No data to display  Initial Impression / Assessment and Plan / UC Course  I have reviewed the triage vital signs and the nursing notes.  Pertinent labs & imaging results that were available during my care of the patient were reviewed by me and considered in my medical decision making (see chart for details).     Conjunctivitis  Patient is a 48 y.o. male who presents after  right eye pain with redness and discharge for the past  2-3  days.  On exam, he has a evidence of conjunctivitis on the right. Treat with Polytrim eye drops.  Advised to follow-up with an ophthalmologist or optometrist, if  discomfort/pain is not improving after 7day course. Recommended pt pick up eye patch from the pharmacy, if desired. Understanding voiced.   Discussed MDM, treatment plan and plan for follow-up with patient who agrees with plan.    Final Clinical Impressions(s) / UC Diagnoses   Final diagnoses:  Bacterial conjunctivitis of right eye     Discharge Instructions      Stop by the pharmacy to pick up your antibiotic eye medication.  Follow up with your primary eyecare provider or [facility] if symptoms suddenly worsen or you have little improvement in your eye symptoms.       ED Prescriptions     Medication Sig Dispense Auth. Provider   trimethoprim-polymyxin b (POLYTRIM) ophthalmic solution Place 2 drops into the right eye every 4 (four) hours. Use for 7 days. 10 mL Ailani Governale, DO      PDMP not reviewed this encounter.   Shadow Schedler, DO 02/15/24 1013

## 2024-02-15 NOTE — ED Triage Notes (Signed)
 Sx x 2 days  Left eye discharge Redness Itching   R: 20/30 L:20/40 B : 20/25   W/correction
